# Patient Record
Sex: Female | Born: 1980 | Race: Black or African American | Hispanic: No | Marital: Married | Smoking: Never smoker
Health system: Southern US, Community
[De-identification: ages and names within clinical notes are randomized; demographics above are authoritative.]

## PROBLEM LIST (undated history)

## (undated) DIAGNOSIS — D649 Anemia, unspecified: Secondary | ICD-10-CM

## (undated) HISTORY — PX: OTHER SURGICAL HISTORY: SHX169

---

## 2003-06-25 ENCOUNTER — Encounter: Payer: Self-pay | Admitting: Obstetrics and Gynecology

## 2003-06-25 ENCOUNTER — Inpatient Hospital Stay (HOSPITAL_COMMUNITY): Admission: AD | Admit: 2003-06-25 | Discharge: 2003-06-25 | Payer: Self-pay | Admitting: Obstetrics and Gynecology

## 2003-06-29 ENCOUNTER — Encounter (INDEPENDENT_AMBULATORY_CARE_PROVIDER_SITE_OTHER): Payer: Self-pay | Admitting: Specialist

## 2003-06-29 ENCOUNTER — Ambulatory Visit (HOSPITAL_COMMUNITY): Admission: RE | Admit: 2003-06-29 | Discharge: 2003-06-29 | Payer: Self-pay | Admitting: Obstetrics and Gynecology

## 2003-09-28 ENCOUNTER — Other Ambulatory Visit: Admission: RE | Admit: 2003-09-28 | Discharge: 2003-09-28 | Payer: Self-pay | Admitting: Obstetrics and Gynecology

## 2003-11-07 ENCOUNTER — Emergency Department (HOSPITAL_COMMUNITY): Admission: EM | Admit: 2003-11-07 | Discharge: 2003-11-07 | Payer: Self-pay | Admitting: Emergency Medicine

## 2004-05-16 ENCOUNTER — Inpatient Hospital Stay (HOSPITAL_COMMUNITY): Admission: AD | Admit: 2004-05-16 | Discharge: 2004-05-19 | Payer: Self-pay | Admitting: Obstetrics and Gynecology

## 2004-05-16 ENCOUNTER — Encounter (INDEPENDENT_AMBULATORY_CARE_PROVIDER_SITE_OTHER): Payer: Self-pay | Admitting: Specialist

## 2004-06-26 ENCOUNTER — Other Ambulatory Visit: Admission: RE | Admit: 2004-06-26 | Discharge: 2004-06-26 | Payer: Self-pay | Admitting: Obstetrics and Gynecology

## 2004-08-21 ENCOUNTER — Encounter: Admission: RE | Admit: 2004-08-21 | Discharge: 2004-08-21 | Payer: Self-pay | Admitting: Family Medicine

## 2005-01-08 ENCOUNTER — Emergency Department (HOSPITAL_COMMUNITY): Admission: EM | Admit: 2005-01-08 | Discharge: 2005-01-09 | Payer: Self-pay | Admitting: Emergency Medicine

## 2006-08-13 ENCOUNTER — Ambulatory Visit: Payer: Self-pay | Admitting: Obstetrics and Gynecology

## 2006-09-16 ENCOUNTER — Other Ambulatory Visit: Admission: RE | Admit: 2006-09-16 | Discharge: 2006-09-16 | Payer: Self-pay | Admitting: Obstetrics and Gynecology

## 2006-12-31 ENCOUNTER — Inpatient Hospital Stay (HOSPITAL_COMMUNITY): Admission: AD | Admit: 2006-12-31 | Discharge: 2006-12-31 | Payer: Self-pay | Admitting: Obstetrics and Gynecology

## 2007-01-25 ENCOUNTER — Inpatient Hospital Stay (HOSPITAL_COMMUNITY): Admission: AD | Admit: 2007-01-25 | Discharge: 2007-01-26 | Payer: Self-pay | Admitting: Obstetrics and Gynecology

## 2007-01-29 ENCOUNTER — Inpatient Hospital Stay (HOSPITAL_COMMUNITY): Admission: AD | Admit: 2007-01-29 | Discharge: 2007-01-29 | Payer: Self-pay | Admitting: Obstetrics and Gynecology

## 2007-03-11 ENCOUNTER — Other Ambulatory Visit: Payer: Self-pay | Admitting: Obstetrics and Gynecology

## 2007-03-14 ENCOUNTER — Inpatient Hospital Stay (HOSPITAL_COMMUNITY): Admission: AD | Admit: 2007-03-14 | Discharge: 2007-03-14 | Payer: Self-pay | Admitting: Obstetrics and Gynecology

## 2007-03-16 ENCOUNTER — Encounter (INDEPENDENT_AMBULATORY_CARE_PROVIDER_SITE_OTHER): Payer: Self-pay | Admitting: Obstetrics and Gynecology

## 2007-03-16 ENCOUNTER — Inpatient Hospital Stay (HOSPITAL_COMMUNITY): Admission: AD | Admit: 2007-03-16 | Discharge: 2007-03-20 | Payer: Self-pay | Admitting: Obstetrics and Gynecology

## 2007-04-27 ENCOUNTER — Other Ambulatory Visit: Admission: RE | Admit: 2007-04-27 | Discharge: 2007-04-27 | Payer: Self-pay | Admitting: Obstetrics and Gynecology

## 2007-10-01 ENCOUNTER — Emergency Department (HOSPITAL_COMMUNITY): Admission: EM | Admit: 2007-10-01 | Discharge: 2007-10-01 | Payer: Self-pay | Admitting: Emergency Medicine

## 2008-03-20 ENCOUNTER — Emergency Department (HOSPITAL_COMMUNITY): Admission: EM | Admit: 2008-03-20 | Discharge: 2008-03-20 | Payer: Self-pay | Admitting: Emergency Medicine

## 2008-03-22 ENCOUNTER — Emergency Department (HOSPITAL_COMMUNITY): Admission: EM | Admit: 2008-03-22 | Discharge: 2008-03-22 | Payer: Self-pay | Admitting: *Deleted

## 2008-12-06 ENCOUNTER — Encounter: Payer: Self-pay | Admitting: Emergency Medicine

## 2008-12-06 ENCOUNTER — Encounter (INDEPENDENT_AMBULATORY_CARE_PROVIDER_SITE_OTHER): Payer: Self-pay | Admitting: Obstetrics and Gynecology

## 2008-12-06 ENCOUNTER — Inpatient Hospital Stay (HOSPITAL_COMMUNITY): Admission: AD | Admit: 2008-12-06 | Discharge: 2008-12-08 | Payer: Self-pay | Admitting: *Deleted

## 2009-03-17 ENCOUNTER — Emergency Department (HOSPITAL_COMMUNITY): Admission: EM | Admit: 2009-03-17 | Discharge: 2009-03-17 | Payer: Self-pay | Admitting: Family Medicine

## 2009-03-23 ENCOUNTER — Emergency Department (HOSPITAL_COMMUNITY): Admission: EM | Admit: 2009-03-23 | Discharge: 2009-03-23 | Payer: Self-pay | Admitting: Family Medicine

## 2009-07-03 ENCOUNTER — Emergency Department (HOSPITAL_COMMUNITY): Admission: EM | Admit: 2009-07-03 | Discharge: 2009-07-03 | Payer: Self-pay | Admitting: Emergency Medicine

## 2009-07-03 ENCOUNTER — Emergency Department (HOSPITAL_COMMUNITY): Admission: EM | Admit: 2009-07-03 | Discharge: 2009-07-03 | Payer: Self-pay | Admitting: Family Medicine

## 2009-11-06 ENCOUNTER — Emergency Department (HOSPITAL_COMMUNITY): Admission: EM | Admit: 2009-11-06 | Discharge: 2009-11-06 | Payer: Self-pay | Admitting: Family Medicine

## 2010-09-29 ENCOUNTER — Encounter: Payer: Self-pay | Admitting: Obstetrics and Gynecology

## 2010-10-27 ENCOUNTER — Inpatient Hospital Stay (INDEPENDENT_AMBULATORY_CARE_PROVIDER_SITE_OTHER)
Admission: RE | Admit: 2010-10-27 | Discharge: 2010-10-27 | Disposition: A | Discharge status: Home / Self Care | Payer: BC Managed Care – PPO | Source: Ambulatory Visit | Attending: Emergency Medicine | Admitting: Emergency Medicine

## 2010-10-27 DIAGNOSIS — R112 Nausea with vomiting, unspecified: Secondary | ICD-10-CM

## 2010-10-27 DIAGNOSIS — K5289 Other specified noninfective gastroenteritis and colitis: Secondary | ICD-10-CM

## 2010-12-12 LAB — POCT URINALYSIS DIP (DEVICE)
Protein, ur: NEGATIVE mg/dL
Specific Gravity, Urine: 1.01 (ref 1.005–1.030)
Urobilinogen, UA: 0.2 mg/dL (ref 0.0–1.0)

## 2010-12-12 LAB — BASIC METABOLIC PANEL
BUN: 10 mg/dL (ref 6–23)
Chloride: 105 mEq/L (ref 96–112)
Creatinine, Ser: 0.65 mg/dL (ref 0.4–1.2)

## 2010-12-12 LAB — DIFFERENTIAL
Basophils Absolute: 0 10*3/uL (ref 0.0–0.1)
Eosinophils Relative: 4 % (ref 0–5)
Lymphocytes Relative: 37 % (ref 12–46)
Lymphs Abs: 1.9 10*3/uL (ref 0.7–4.0)
Neutro Abs: 2.8 10*3/uL (ref 1.7–7.7)

## 2010-12-12 LAB — CBC
HCT: 43 % (ref 36.0–46.0)
Platelets: 170 10*3/uL (ref 150–400)
RDW: 14.9 % (ref 11.5–15.5)
WBC: 5.1 10*3/uL (ref 4.0–10.5)

## 2010-12-12 LAB — POCT PREGNANCY, URINE: Preg Test, Ur: NEGATIVE

## 2010-12-15 LAB — POCT RAPID STREP A (OFFICE): Streptococcus, Group A Screen (Direct): NEGATIVE

## 2010-12-18 LAB — CBC
HCT: 34 % — ABNORMAL LOW (ref 36.0–46.0)
Hemoglobin: 11.6 g/dL — ABNORMAL LOW (ref 12.0–15.0)
RBC: 4.07 MIL/uL (ref 3.87–5.11)
WBC: 7.2 10*3/uL (ref 4.0–10.5)

## 2010-12-19 LAB — POCT I-STAT, CHEM 8
BUN: 11 mg/dL (ref 6–23)
Creatinine, Ser: 0.7 mg/dL (ref 0.4–1.2)
Glucose, Bld: 103 mg/dL — ABNORMAL HIGH (ref 70–99)
Potassium: 3.6 mEq/L (ref 3.5–5.1)
Sodium: 136 mEq/L (ref 135–145)
TCO2: 24 mmol/L (ref 0–100)

## 2010-12-19 LAB — URINALYSIS, ROUTINE W REFLEX MICROSCOPIC
Bilirubin Urine: NEGATIVE
Glucose, UA: NEGATIVE mg/dL
Hgb urine dipstick: NEGATIVE
Specific Gravity, Urine: 1.015 (ref 1.005–1.030)
Urobilinogen, UA: 0.2 mg/dL (ref 0.0–1.0)
pH: 8.5 — ABNORMAL HIGH (ref 5.0–8.0)

## 2010-12-19 LAB — CBC
HCT: 38.9 % (ref 36.0–46.0)
MCV: 82.1 fL (ref 78.0–100.0)
Platelets: 148 10*3/uL — ABNORMAL LOW (ref 150–400)
RBC: 4.74 MIL/uL (ref 3.87–5.11)
WBC: 7.5 10*3/uL (ref 4.0–10.5)

## 2010-12-19 LAB — DIFFERENTIAL
Eosinophils Relative: 1 % (ref 0–5)
Lymphocytes Relative: 16 % (ref 12–46)
Lymphs Abs: 1.2 10*3/uL (ref 0.7–4.0)
Monocytes Relative: 4 % (ref 3–12)

## 2010-12-19 LAB — POCT PREGNANCY, URINE: Preg Test, Ur: NEGATIVE

## 2011-01-21 NOTE — Op Note (Signed)
NAMEEFRATA, BRUNNER            ACCOUNT NO.:  [id number]   MEDICAL RECORD NO.:  [id number]          PATIENT TYPE:  INP   LOCATION:  9143                          FACILITY:  [facility]   PHYSICIAN:  James A. [name], M.D.DATE OF BIRTH:  1980-10-27   DATE OF PROCEDURE:  03/16/2007  DATE OF DISCHARGE:  03/20/2007                               OPERATIVE REPORT   PREOPERATIVE DIAGNOSIS:  1. Intrauterine pregnancy at [redacted] weeks gestation.  2. Previous cesarean section.  3. History of intrauterine growth restriction.  4. Labor.   POSTOPERATIVE DIAGNOSIS:  1. Intrauterine pregnancy at [redacted] weeks gestation.  2. Previous cesarean section.  3. History of intrauterine growth restriction.  4. Labor.   PROCEDURE:  Repeat low transverse cesarean section.   SURGEON:  Rudy Jew. [name], M.D.   ANESTHESIA:  Spinal.   FINDINGS:  5 pounds 11 ounces female, Apgars 9 at 1 minute and 9 at 5  minutes sent to the newborn nursery.   ESTIMATED BLOOD LOSS:  500 mL.   COMPLICATIONS:  None.   PACKS AND DRAINS:  Foley.   COUNTS:  Sponge, needle, and instrument count reported as correct x2.   PROCEDURE IN DETAIL:  The patient was taken to the operating room and  placed in the sitting position.  After spinal anesthetic was  administered, she was placed in the dorsal supine position and prepped  and draped in the usual manner for abdominal surgery.  A Foley catheter  was placed.  A Pfannenstiel tensor incision was made down to the level  of the fascia which was nicked with a knife and incised transversely  with Mayo scissors.  The underlying rectus muscles were separated from  the fascia using sharp and blunt dissection.  The rectus muscles were  separated in the midline exposing the peritoneum which was elevated with  hemostats and entered atraumatically with Metzenbaum scissors.  The  incision was extended longitudinally.  The uterus was then entered  through a low transverse incision using sharp and blunt  dissection.  The  fluid was noted be clear.  The infant was delivered from a vertex  presentation in an atraumatic manner.  The infant was suctioned.  The  cord was doubly clamped, cut and the infant given immediately to the  awaiting pediatrics team.  The placenta and membranes were removed in  their entirety and submitted to pathology.  The uterus was exteriorized.  The uterus was then closed in two running layers of #1 Vicryl.  The  first was a running locking layer, the second was a running,  intermittently locking, imbricating layer.  Hemostasis was noted.  During closure, anesthesia noted that the urine was slightly blood  tinged.  Hence, methylene blue was instilled into the bladder in a  volume of approximately 400 mL.  There was no extravasation of the dye  noted at all.  Hence, the bladder was once again drained and the  procedure resumed.  The uterus, tubes and ovaries were inspected and  found to be normal and were returned to the abdominal cavity.  Copious  irrigation was accomplished.  Hemostasis was noted.  The peritoneum was  then closed with 3-0 Vicryl in a running fashion.  The fascia was closed  with 0 Vicryl in a running fashion.  The skin was closed with staples.  The patient tolerated the procedure extremely well and was returned to  the recovery room in good condition.      James A. [name], M.D.  Electronically Signed     JAM/MEDQ  D:  03/19/2007  T:  03/20/2007  Job:  [id number]

## 2011-01-21 NOTE — Op Note (Signed)
Daisy Reed, Daisy Reed NO.:  [id number]   MEDICAL RECORD NO.:  [id number]          PATIENT TYPE:  INP   LOCATION:  9303                          [facility]  [facility]   PHYSICIAN:  Huel Cote, M.D. DATE OF BIRTH:  [birth year]   DATE OF PROCEDURE:  12/06/2008  DATE OF DISCHARGE:                               OPERATIVE REPORT   PREOPERATIVE DIAGNOSES:  1. Acute abdominal pain.  2. Large cystic mass in the right pelvis.   POSTOPERATIVE DIAGNOSES:  1. Acute abdominal pain.  2. Large cystic mass in the right pelvis with torsion of right ovarian      mass.   PROCEDURES:  Minilaparotomy, right salpingo-oophorectomy.   SURGEON:  Huel Cote, MD   ANESTHESIA:  General.   FINDINGS:  There was a large necrotic appearing right ovarian mass  approximately 10 cm in diameter, could be a hemorrhagic cyst, which  torsed due to the necrotic nature.  It was difficult to determine the  pathology.  Normal left ovary and tube were noted.  Normal uterus and  appendix were noted.   SPECIMEN:  The right ovary and tube were sent to Pathology.   ESTIMATED BLOOD LOSS:  75 mL.   URINE OUTPUT:  175 mL clear urine.   INTRAVENOUS FLUIDS:  1200 mL LR.   PROCEDURE:  The patient was taken to the operating room where general  anesthesia was obtained without difficulty.  She was then prepped and  draped in normal sterile fashion in the dorsal supine position with a  Foley catheter in place.  An incision was made through a preexisting  scar on the abdomen and small Pfannenstiel minilaparotomy incision made.  This was then carried down to the underlying layer of fascia by sharp  dissection and Bovie cautery.  The abdominal wall itself was quite  scarred from her previous two C-sections.  The fascia was nicked in the  midline and the incision was extended laterally with Mayo scissors.  The  inferior aspect of the incision was grasped with Kocher clamps and  elevated.  The rectus  muscles underneath were quite adherent to the  fascia.  So with some difficulty, the rectus muscles were dissected away  from the lower fascial segment with Mayo scissors.  In a similar  fashion, the superior aspect was elevated and dissected off the rectus  muscles.  These were also quite adherent and this was a difficult  dissection.  The peritoneal cavity was entered sharply and the incision  extended both superiorly and inferiorly with careful attention to avoid  both bowel and bladder.  With this in place, pelvic washings were  obtained given that the exact nature of the pelvic mass was unknown and  the Alexis medium wound retractor placed within the incision.  The right  adnexa was immediately identifiable as it was quite enlarged.  This was  elevated and pulled through the incision.  It was noted to be torsed  upon itself, in 3-4 four twists, it was untorsed and appeared to be gray  and necrotic in nature.  The tissue appearing nonviable.  Decision was  made  to proceed with an RSO, thus the infundibulopelvic and the utero-  ovarian ligament were crossclamped as well as the fallopian tube and two  Kelly clamps.  The adnexa itself was then amputated from the pedicle.  The pedicle was then secured with two suture ligatures of 0 Vicryl with  good hemostasis noted.  The abdomen and pelvis were then cleared of all  excess bleeding and irrigation from collecting the washings.  The ureter  on the right was inspected and found to be normal.  The remainder of the  pelvis appeared relatively unscarred and again, the left ovary and tube  were completely normal and the appendix appeared normal.  At this point,  all instruments and sponges were removed from the patient's abdomen.  The pedicle once again inspected and seemed to be hemostatic; therefore,  the peritoneum and rectus muscles were reapproximated with several  interrupted sutures of 0 Vicryl.  The fascia was noted to have a small   defect in the superior aspect from the difficult dissection and this was  closed separately with a running suture of 0 Vicryl.  The fascia itself  was then closed in a running fashion with 0 Vicryl.  The subcutaneous  tissue was reapproximated with 3-0 plain as the patient had a redundant  abdomen flap from her prior surgeries and pregnancies, and this was done  in an attempt to even out the layers.  Finally, the skin was closed with  staples and again, sponge, lap, and needle counts were correct x2.      Huel Cote, M.D.  Electronically Signed     KR/MEDQ  D:  12/06/2008  T:  12/07/2008  Job:  [id number]

## 2011-01-21 NOTE — Discharge Summary (Signed)
Daisy Reed, DIMITROFF            ACCOUNT NO.:  [id number]   MEDICAL RECORD NO.:  [id number]          PATIENT TYPE:  INP   LOCATION:  9303                          [facility]   PHYSICIAN:  Sherron Monday, MD        DATE OF BIRTH:  01/13/1981   DATE OF ADMISSION:  12/06/2008  DATE OF DISCHARGE:  12/08/2008                               DISCHARGE SUMMARY   ADMITTING DIAGNOSES:  Right acute abdomen, large cystic mass in the  right adnexa.   DISCHARGE DIAGNOSES:  Right acute abdomen, large cystic mass in the  right adnexa, status post right salpingo-oophorectomy with exploratory  laparotomy.   PROCEDURE:  Exploratory laparotomy, right salpingo-oophorectomy.   HISTORY OF PRESENT ILLNESS:  A 30 year old G3, P2-0-1-2 without cycle  secondary to an IUD for several years who approximately a year ago began  to have pain increasing in intensity and acuity.  On the morning of the  31st, went to the ER, received multiple doses of narcotics.  Nausea,  vomiting, off and on all day with pain.   PAST MEDICAL HISTORY:  Nonsignificant.   PAST SURGICAL HISTORY:  1. Significant for low transverse cesarean section x2, first time for      failure to progress, the second one is a repeat.  2. Miscarriage and D and C.   PAST OB/GYN HISTORY:  Two term low transverse cesarean sections in 2005,  2008, and a miscarriage with D and C.  No abnormal Pap smears or  sexually transmitted diseases.   MEDICATIONS:  Mirena.   ALLERGIES:  No known drug allergies.   SOCIAL HISTORY:  She is married and denies alcohol, tobacco, or drug  use.   FAMILY HISTORY:  The patient denies.   On admission, she is afebrile.  Vital signs stable with guarding and  diffuse tenderness without rebound, but has received multiple doses of  narcotics.  On ultrasound, multiloculated cystic right adnexa, 10 cm x 6  cm x 7.7 cm was noted.  Uterus within normal limits with IUD and left  ovary within normal limits.  Her pain was  consistent with a torsion, so  she underwent an exploratory laparotomy for treatment in the course of  the minilaparotomy, a large necrotic right ovarian mass was noted  approximately 10 cm and RSO was performed through minilaparotomy and  this was sent to pathology.  EBL was approximately 75 mL.  Her  postoperative course was relatively uncomplicated.  She had well  controlled pain with medication.  She is ambulating, voiding, and had  passed gas on postoperative day #2, requesting discharge to home.  She  will be discharged to home with staples that will be removed earlier in  the week with a checkup with Dr. Senaida Ores.  Her abdomen was soft, it  was tender with good bowel sounds and  nondistended.  She was discharged to home with prescription for Motrin,  Vicodin, and instructions for Colace and MiraLax for constipation.  She  was given routine discharge instructions and numbers to call with any  questions or problems.      Sherron Monday, MD  Electronically Signed     JB/MEDQ  D:  12/08/2008  T:  12/09/2008  Job:  [id number]

## 2011-01-24 NOTE — Op Note (Signed)
Daisy Reed, Daisy Reed                        ACCOUNT NO.:  [id number]   MEDICAL RECORD NO.:  [id number]                   PATIENT TYPE:  INP   LOCATION:  9111                                 [facility]   PHYSICIAN:  Rudy Jew. [name], M.D.             DATE OF BIRTH:  05-17-1981   DATE OF PROCEDURE:  05/16/2004  DATE OF DISCHARGE:                                 OPERATIVE REPORT   PREOPERATIVE DIAGNOSES:  1.  Intrauterine pregnancy at 40 1/[redacted] weeks gestation.  2.  Arrest disorder of dilatation in labor.   POSTOPERATIVE DIAGNOSES:  1.  Intrauterine pregnancy at 40 1/[redacted] weeks gestation.  2.  Arrest disorder of dilatation in labor.   PROCEDURE:  Primary low transverse cesarean section.   SURGEON:  Rudy Jew. [name], M.D.   ANESTHESIA:  Epidural.   FINDINGS:  7 pound 9 ounce female, Apgar's 9 at 1 minute, 9 at 5 minute,  sent to the newborn nursery.  Nuchal cord x2.   ESTIMATED BLOOD LOSS:  800 mL.   COMPLICATIONS:  None.   PACKS/DRAINS:  Foley.   COUNTS:  Sponge, needle, instrument counts reported as correct x2.   DESCRIPTION OF PROCEDURE:  The patient was taken to the operating room and  placed in the dorsal supine position.  Epidural anesthetic was dosed to a  surgical level.  The patient was prepped and draped in the usual manner for  abdominal surgery. A Foley catheter had been previously placed.  A  Pfannenstiel skin incision was made down to the level of the fascia which  was nicked with a knife and incised transversely with Mayo scissors. The  underlying rectus muscles were separated from the fascia using sharp and  blunt dissection.  The rectus muscles were separated in the midline exposing  the peritoneum which was elevated with hemostats and entered atraumatically  with Metzenbaum scissors. The incision was extended longitudinally.  The  uterus was identified and a bladder created by incising the anterior uterine  serosa. It was undermined and the bladder  retracted inferiorly with the  bladder blade. The uterus was entered through a low transverse incision  using sharp and blunt dissection.  The infant was delivered from the vertex  presentation. There was a nuchal cord x2. At delivery of the head, the  infant's airway was suctioned. Full delivery was then accomplished. The cord  was triply clamped, cut and the infant given immediately to the awaiting  pediatrics team.  Arterial cord pH was obtained from an isolated segment.  Then regular cord blood was obtained. The placenta and membranes were  removed in their entirety by gentle traction on the umbilical cord only. The  placenta and membranes were sent to pathology for histologic evaluation. The  uterus was exteriorized. The uterus was then closed in two running layers  with #1 Vicryl. The first was a running locking layer, the second was a  running, intermittently locking,  and imbricating layer.  Three additional  figure-of-eight sutures were required to obtain hemostasis.  Hemostasis was  noted. The uterus tubes and ovaries were inspected and found to be otherwise  normal.  They were returned to the abdominal cavity. Copious irrigation was  accomplished. Hemostasis was noted. The peritoneum was then closed with #0  Vicryl in a running fashion. The fascia was closed with #0 Vicryl in a  running fashion. The skin was closed with staples.   The patient tolerated the procedure extremely well and was returned to the  recovery room in good condition.  At the conclusion of the procedure, the  urine was clear and copious.                                               James A. [name], M.D.    JAM/MEDQ  D:  05/16/2004  T:  05/17/2004  Job:  [id number]

## 2011-01-24 NOTE — Discharge Summary (Signed)
Daisy Reed, Daisy Reed            ACCOUNT NO.:  [id number]   MEDICAL RECORD NO.:  [id number]          PATIENT TYPE:  INP   LOCATION:  9143                          [facility]   PHYSICIAN:  James A. [name], M.D.DATE OF BIRTH:  [birth year]   DATE OF ADMISSION:  03/16/2007  DATE OF DISCHARGE:  03/20/2007                               DISCHARGE SUMMARY   DISCHARGE DIAGNOSES:  1. Intrauterine pregnancy at [redacted] weeks gestation, delivered.  2. Previous cesarean section.  3. Intrauterine growth restriction.  4. O negative blood type.  5. Anemia - asymptomatic   OPERATIONS/PROCEDURES:  Repeat low transverse cesarean section.   CONSULTATIONS:  None.   DISCHARGE MEDICATIONS:  Percocet, Motrin 600 mg, Chromagen, prenatal  vitamins.   HISTORY AND PHYSICAL:  A 30 year old, gravida 3, para 1, AB 1 at [redacted]  weeks gestation with the aforementioned risk factors.  The patient  presented for repeat cesarean section. For the remainder of the history  and physical please see chart.   HOSPITAL COURSE:  The patient was admitted to [facility]  [place].  Initial laboratory studies were drawn.  On March 16, 2007,  she was taken to the operating room and underwent repeat low transverse  cesarean section per Dr. Sylvester Harder.  The procedure yielded a 5  pound 11 ounces female, Apgar's 9 at 1 minute and 9 at 5 minutes, sent to  the newborn nursery.  The patient's postoperative course was benign and  characterized only by an asymptomatic anemia.  The patient was felt to  be stable for discharge March 20, 2007 and was discharged home afebrile  and in satisfactory condition.   DISPOSITION:  The patient is to return to [facility]s in 4-6 weeks for post operative evaluation.      James A. [name], M.D.  Electronically Signed     JAM/MEDQ  D:  05/13/2007  T:  05/13/2007  Job:  [id number]

## 2011-01-24 NOTE — Op Note (Signed)
   NAME:  Daisy Reed, Daisy Reed NO.:  [id number]   MEDICAL RECORD NO.:  [id number]                   PATIENT TYPE:  AMB   LOCATION:  SDC                                  [facility]   PHYSICIAN:  James A. [name], M.D.             DATE OF BIRTH:  10/14/1980   DATE OF PROCEDURE:  06/29/2003  DATE OF DISCHARGE:                                 OPERATIVE REPORT   PREOPERATIVE DIAGNOSIS:  Missed abortion at approximately eight weeks.   POSTOPERATIVE DIAGNOSIS:  Missed abortion at approximately eight weeks,  pathology pending.   PROCEDURE:  Suction dilatation and curettage.   SURGEON:  Rudy Jew. [name], M.D.   ANESTHESIA:  Monitored anesthesia care with 1% Xylocaine paracervical block  (18 mL).   FINDINGS:  Uterus sounded to approximately 7 cm, and it was noted to be  anteverted.  A moderate amount of products of conception.   ESTIMATED BLOOD LOSS:  50 mL.   COMPLICATIONS:  None.   PACKS AND DRAINS:  None.   DESCRIPTION OF PROCEDURE:  The patient was taken to the operating room and  placed in the dorsal supine position.  After adequate IV sedation was  administered, she was placed in the lithotomy position and prepped and  draped in the usual manner for vaginal surgery.  A posterior weighted  retractor was placed per vagina.  The anterior lip of the cervix was grasped  with a single-tooth tenaculum.  The uterus was gently sounded to  approximately 7 cm and noted to be anteverted.  The cervix was then serially  dilated to a size 25 [place] using [employer] dilators.  An 8 mm suction curette  was introduced into the uterine cavity.  Suction was applied.  A moderate  amount of apparent products of conception was delivered through the tubing.  After several passes of the suction curette, no additional tissue was  obtained.  At this point the curettage portion of the procedure was  concluded.  The tenaculum was removed and a small anterior cervical  laceration was noted.  It was easily closed with a 3-0 chromic in an  interrupted fashion.  Hemostasis was noted and the procedure terminated.   The patient tolerated the procedure extremely well and was returned to the  recovery room in good condition.  As she is Rh negative, RhoGAM will be  administered.                                               James A. [name], M.D.    JAM/MEDQ  D:  06/29/2003  T:  06/29/2003  Job:  [id number]

## 2011-01-24 NOTE — Discharge Summary (Signed)
Daisy Reed, Daisy Reed            ACCOUNT NO.:  [id number]   MEDICAL RECORD NO.:  [id number]          PATIENT TYPE:  INP   LOCATION:  9111                          [facility]   PHYSICIAN:  Rudy Jew. [name], M.D.DATE OF BIRTH:  1981-09-05   DATE OF ADMISSION:  05/16/2004  DATE OF DISCHARGE:  05/19/2004                                 DISCHARGE SUMMARY   DISCHARGE DIAGNOSES:  1.  Intrauterine pregnancy at 40+ weeks' gestation, delivered.  2.  Advanced cervical changes at term.  3.  Group B strep carrier.  4.  Rh negative.  5.  Arrest disorder of dilatation.   OPERATIONS/SPECIAL PROCEDURES:  Primary low transverse cesarean section.   CONSULTATIONS:  None.   DISCHARGE MEDICATIONS:  1.  Iron.  2.  Prenatal vitamins.  3.  Percocet.   HISTORY OF PRESENT ILLNESS:  This is a 30 year old gravida 2, para 0, AB 1,  at 40 weeks 5 days' gestation.  Please note aforementioned risk factors.  The patient presented for induction secondary to the above and advanced  cervical changes at term.  For the remainder of the history and physical,  please see the chart.   HOSPITAL COURSE:  The patient was admitted to [facility]  [place].  Initial laboratory studies were drawn.  Initial examination  revealed the cervix to be 3 cm dilated, 75% effaced, -2 station and vertex  presentation.  Artificial rupture of membranes was accomplished.  Intrauterine pressure catheter was placed.  The patient was ultimately  diagnosed May 16, 2004, with an arrest disorder of dilatation in labor.  She was taken to the operating room and underwent primary low transverse  cesarean section per Dr. Sylvester Harder.  The procedure yielded a 7 pound, 9  ounce female, Apgars 9 at one minute, 9 at five minutes, sent to the newborn  nursery.  Arterial cord pH was 7.32.  The patient's postoperative course was  complicated only by anemia.  Hemoglobin September 2005 was 8.4.  Hemoglobin  April 17, 2004, was  6.9.  On May 19, 2004, the patient was felt to be  stable for discharge and discharged home per Dr. Lily Peer, afebrile and in  satisfactory condition.   DISPOSITION:  The patient is to return to [facility]st[facility]s in six weeks for postpartum evaluation.     JAM/MEDQ  D:  06/05/2004  T:  06/06/2004  Job:  [id number]

## 2011-03-05 ENCOUNTER — Inpatient Hospital Stay (INDEPENDENT_AMBULATORY_CARE_PROVIDER_SITE_OTHER)
Admission: RE | Admit: 2011-03-05 | Discharge: 2011-03-05 | Disposition: A | Discharge status: Home / Self Care | Payer: BC Managed Care – PPO | Source: Ambulatory Visit | Attending: Emergency Medicine | Admitting: Emergency Medicine

## 2011-03-05 DIAGNOSIS — J309 Allergic rhinitis, unspecified: Secondary | ICD-10-CM

## 2011-06-05 LAB — URINE MICROSCOPIC-ADD ON

## 2011-06-05 LAB — URINALYSIS, ROUTINE W REFLEX MICROSCOPIC
Glucose, UA: NEGATIVE
Specific Gravity, Urine: 1.02
Urobilinogen, UA: 1
pH: 5.5

## 2011-06-05 LAB — DIFFERENTIAL
Lymphs Abs: 1
Monocytes Relative: 7
Neutro Abs: 7.1
Neutrophils Relative %: 81 — ABNORMAL HIGH

## 2011-06-05 LAB — POCT PREGNANCY, URINE: Preg Test, Ur: NEGATIVE

## 2011-06-05 LAB — POCT I-STAT, CHEM 8
BUN: 11
Chloride: 105
Creatinine, Ser: 1
Potassium: 3.3 — ABNORMAL LOW
Sodium: 140

## 2011-06-05 LAB — CBC
Hemoglobin: 13.1
RBC: 4.77
WBC: 8.8

## 2011-06-05 LAB — RAPID STREP SCREEN (MED CTR MEBANE ONLY): Streptococcus, Group A Screen (Direct): POSITIVE — AB

## 2011-06-23 IMAGING — CT CT PELVIS W/ CM
3 of 4 series · 14 of 32 positions shown, 19 images · IV contrast (water/omni  & 100 ML OMNI 300)
Comparison: 12/06/2008.

CT ABDOMEN

CLINICAL DATA: Abdominal pain, nausea and vomiting.

CT ABDOMEN AND PELVIS WITH CONTRAST
TECHNIQUE: Multidetector CT imaging of the abdomen and pelvis was
performed using the standard protocol following bolus
administration of intravenous contrast.
Contrast: 100 ml Nmnipaque-BOO

[Series 2: routine abdomen · axial · 0.70mm/px · z∈[-336,-110]mm · 4 of 77 slices shown, 9 images]
[im 16/77  soft-tissue]
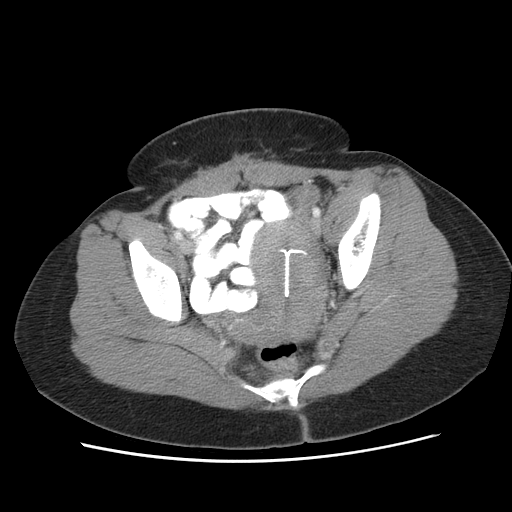
[im 16/77  lung]
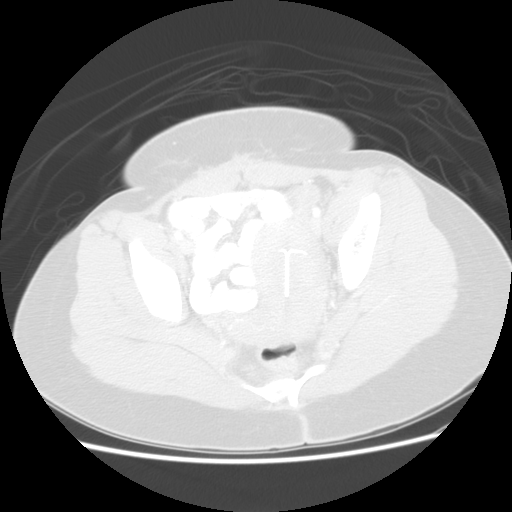
[im 16/77  bone]
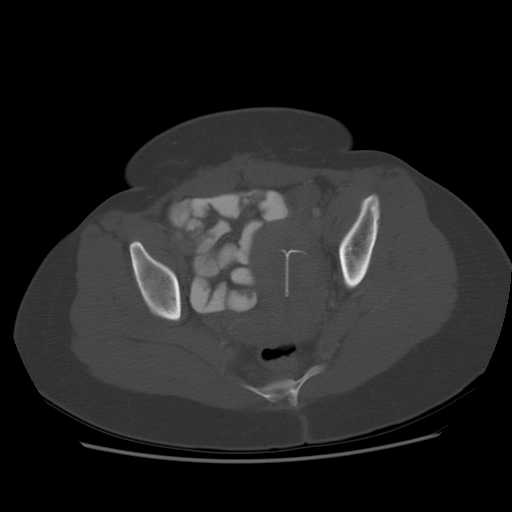
[im 31/77  soft-tissue]
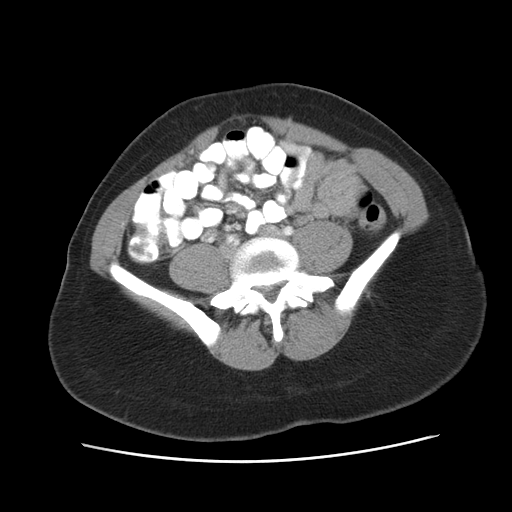
[im 31/77  lung]
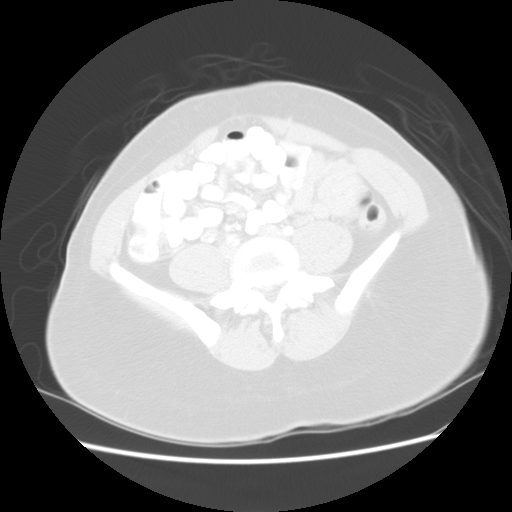
[im 46/77  soft-tissue]
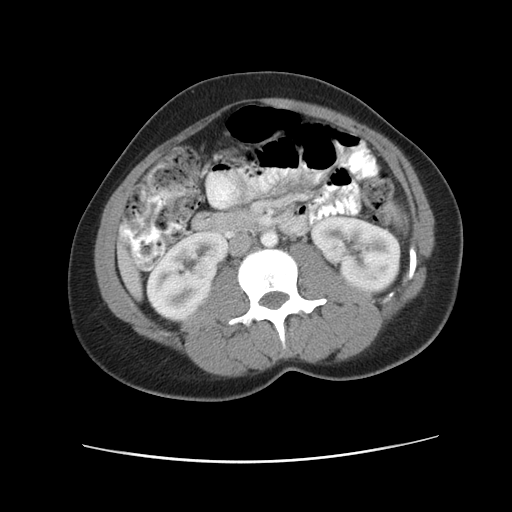
[im 46/77  lung]
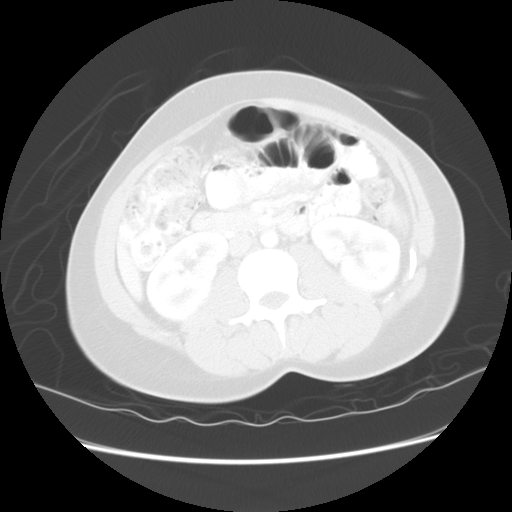
[im 61/77  soft-tissue]
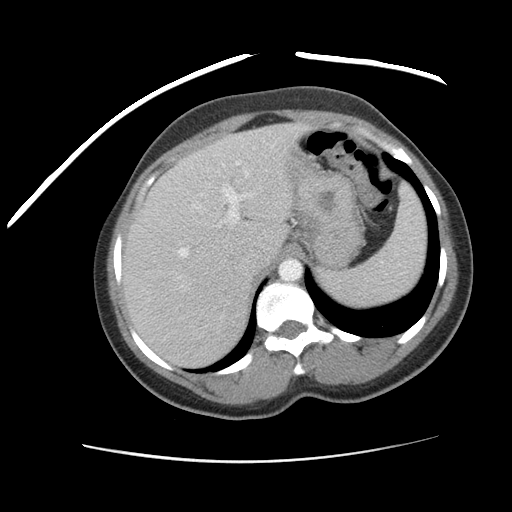
[im 61/77  lung]
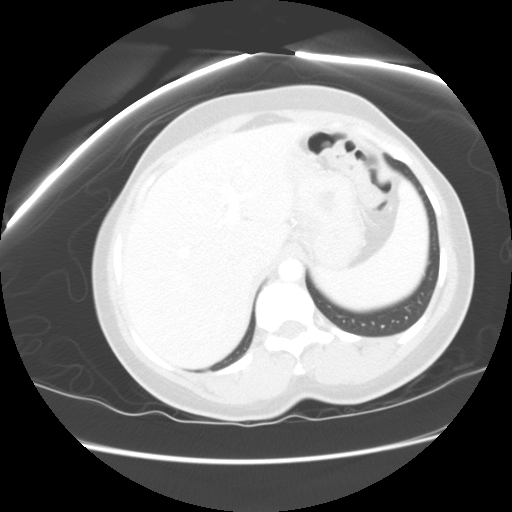

[Series 400: reformatted · sagittal · 0.80mm/px · 8 of 158 slices shown (1 of 2)]
[im 14/158  soft-tissue]
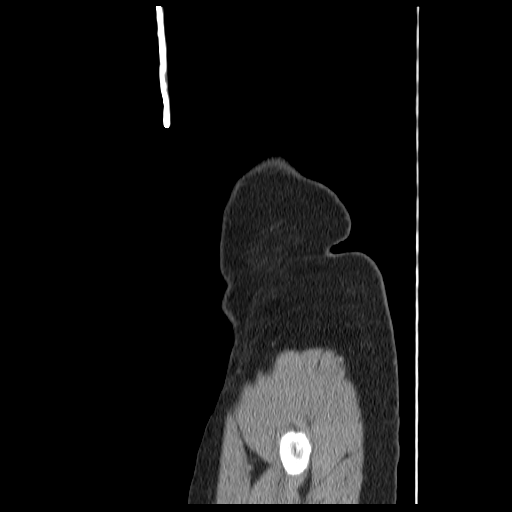
[im 40/158  soft-tissue]
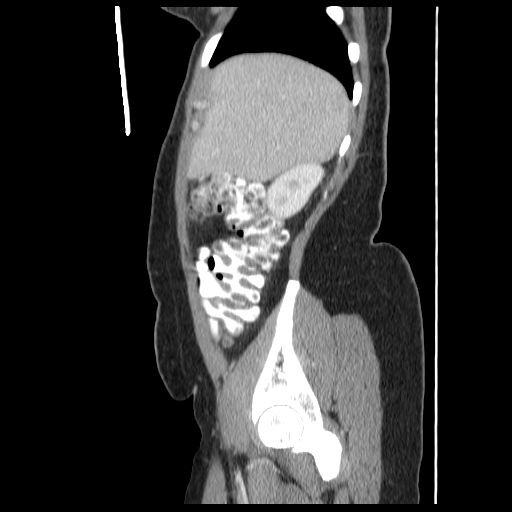
[im 53/158  soft-tissue]
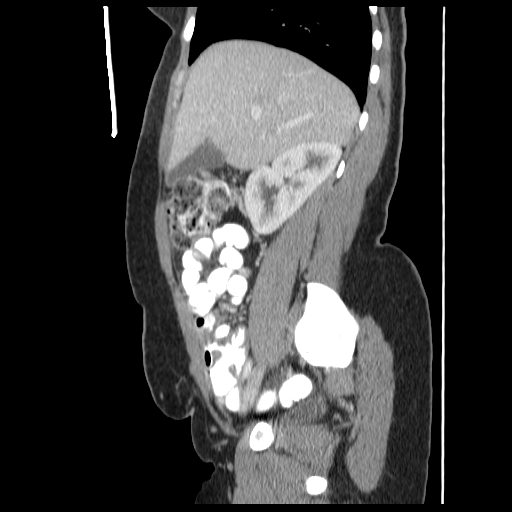
[im 66/158  soft-tissue]
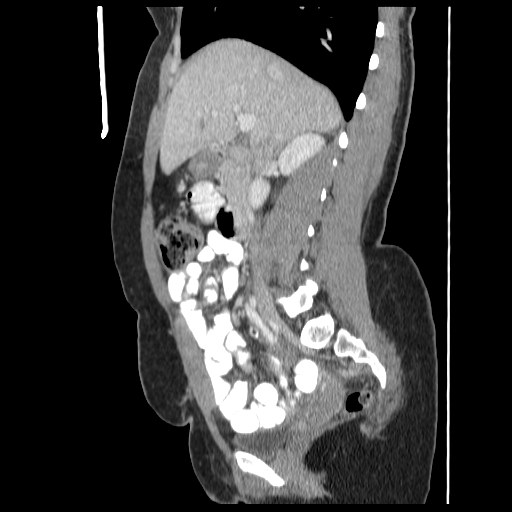
[im 92/158  soft-tissue]
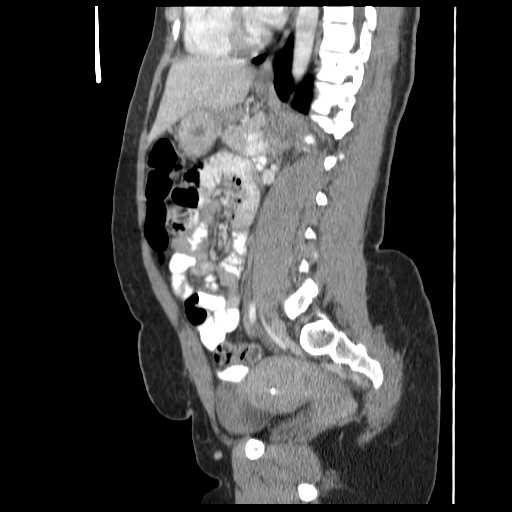
[im 105/158  soft-tissue]
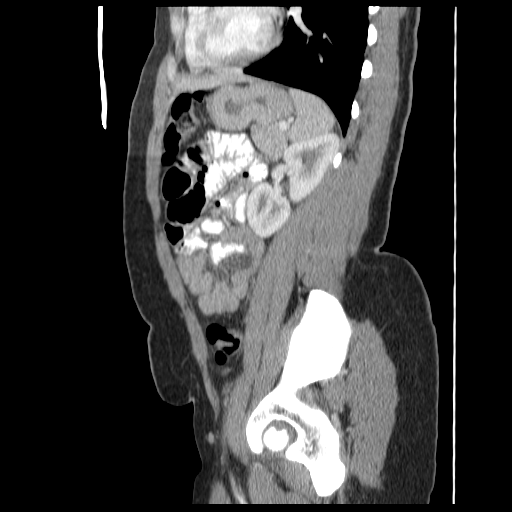
[im 118/158  soft-tissue]
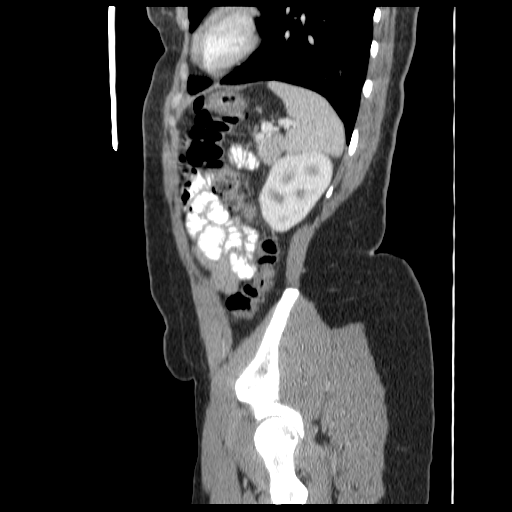
[im 144/158  soft-tissue]
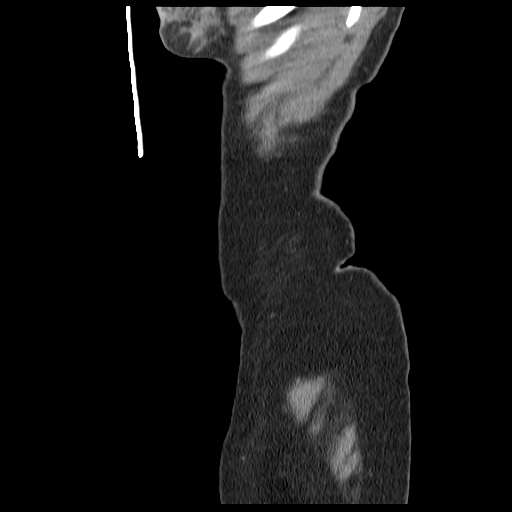

[Series 401: reformatted · coronal · 0.80mm/px · 2 of 131 slices shown (2 of 2)]
[im 14/131  soft-tissue]
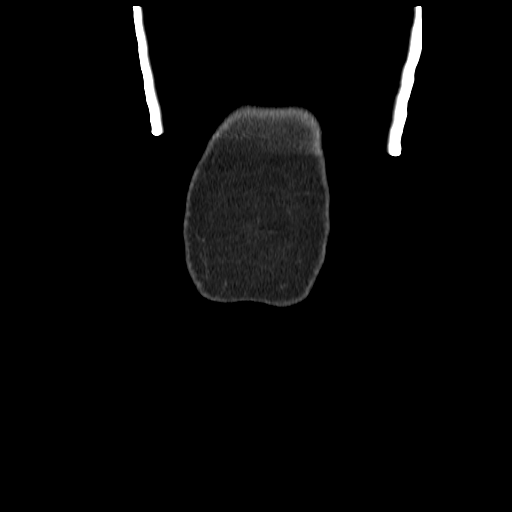
[im 27/131  soft-tissue]
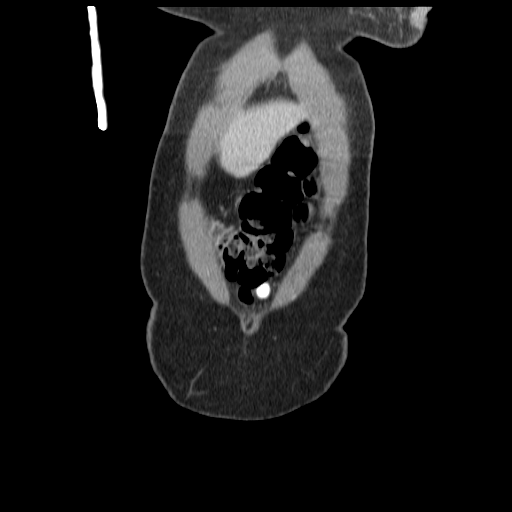

[14 of 32 positions shown; findings below may reference images not displayed]

FINDINGS: Normal appearing liver, spleen, pancreas, gallbladder,
kidneys and adrenal glands.  Mildly dilated proximal small bowel
loops with normal caliber distal loops.  No hernia or mass seen.
No free peritoneal fluid or air.  No enlarged lymph nodes.  Clear
lung bases.  Unremarkable bones.
IMPRESSION: Mild jejunal ileus or partial obstruction.

CT PELVIS
FINDINGS: Intrauterine device within the endometrial cavity.
Unremarkable colon and small bowel loops.  Poorly distended urinary
bladder.  2.8 cm simple appearing left ovarian cyst as well as
additional smaller left ovarian follicles.  The right ovary is not
identified and may be surgically absent.  Normal appearing
appendix.  No free peritoneal fluid.  Unremarkable bones.  No
enlarged lymph nodes.
IMPRESSION: 1.  2.8 cm simple appearing left ovarian cyst.
2.  No acute abnormality.

## 2011-06-24 LAB — CBC
HCT: 27.8 — ABNORMAL LOW
HCT: 34.3 — ABNORMAL LOW
Hemoglobin: 9.2 — ABNORMAL LOW
MCHC: 33.2
MCHC: 33.3
MCV: 78.4
MCV: 78.9
Platelets: 157
Platelets: 166
Platelets: 196
RBC: 3.54 — ABNORMAL LOW
RDW: 15.6 — ABNORMAL HIGH
RDW: 16.4 — ABNORMAL HIGH
WBC: 6.4

## 2011-06-24 LAB — TYPE AND SCREEN: Antibody Screen: POSITIVE

## 2011-06-24 LAB — RH IMMUNE GLOB WKUP(>/=20WKS)(NOT WOMEN'S HOSP)

## 2011-09-08 ENCOUNTER — Emergency Department (INDEPENDENT_AMBULATORY_CARE_PROVIDER_SITE_OTHER): Payer: BC Managed Care – PPO

## 2011-09-08 ENCOUNTER — Emergency Department (INDEPENDENT_AMBULATORY_CARE_PROVIDER_SITE_OTHER)
Admission: EM | Admit: 2011-09-08 | Discharge: 2011-09-08 | Disposition: A | Discharge status: Home / Self Care | Payer: BC Managed Care – PPO | Source: Home / Self Care | Attending: Family Medicine | Admitting: Family Medicine

## 2011-09-08 DIAGNOSIS — R05 Cough: Secondary | ICD-10-CM

## 2011-09-08 DIAGNOSIS — R059 Cough, unspecified: Secondary | ICD-10-CM

## 2011-09-08 MED ORDER — GUAIFENESIN-CODEINE 100-10 MG/5ML PO SYRP: 5.0000 mL | ORAL_SOLUTION | Freq: Three times a day (TID) | ORAL | Status: AC | PRN

## 2011-09-08 NOTE — ED Notes (Signed)
3 day hx of cough with fever. Has coughed up white sputum.  Has body aches.  Taken Nyquil for symptoms

## 2011-09-08 NOTE — ED Provider Notes (Signed)
Daisy Reed is a 30 year old woman with one week of cold symptoms described as runny nose fevers chills. She additionally has had a cough for this last week however today she noted some flecks of blood in her sputum. Additionally she notes that she is a sore throat .She feels well otherwise. She thinks she is getting worse overall however.   No new fevers or chills no dyspnea. Is not a smoker.  PMH reviewed.  ROS as above otherwise neg Medications reviewed.   Exam:  BP 120/80  Pulse 82  Temp(Src) 98.5 F (36.9 C) (Oral)  Resp 20  SpO2 99% Gen: Well NAD HEENT: EOMI,  MMM Lungs: Normal work of breathing, coarse breath sounds bilaterally.  No wheeze or rhonchi. Heart: RRR no MRG Abd: NABS, NT, ND Exts: Non edematous BL  LE, warm and well perfused.   CXR: Essentially normal  A/P: 30 year old woman with URI with blood in her sputum. I think her blood in her sputum is coming from sore throat or airway irritation with cough.  Doubt serious cause as her vital signs are normal as is her chest x-ray and she appears well.  Plan to provide codeine with guaifenesin cough medicine.  Advise that this should get better in one week and if not to return to clinic for further evaluation. Handout given.   Clementeen Graham 09/08/11 2043

## 2011-09-09 NOTE — ED Provider Notes (Signed)
Medical screening examination/treatment/procedure(s) were performed by non-physician practitioner and as supervising physician I was immediately available for consultation/collaboration.   Oceanna Arruda DOUGLAS MD.    Lesslie Mossa Douglas Destanee Bedonie, MD 09/09/11 1403 

## 2012-04-07 ENCOUNTER — Encounter (HOSPITAL_COMMUNITY): Payer: Self-pay | Admitting: *Deleted

## 2012-04-07 ENCOUNTER — Encounter (HOSPITAL_COMMUNITY): Payer: Self-pay | Admitting: Pharmacist

## 2012-04-14 MED ORDER — DEXTROSE 5 % IV SOLN
2.0000 g | INTRAVENOUS | Status: AC
  Administered 2012-04-15: 2 g via INTRAVENOUS
  Filled 2012-04-14: qty 2

## 2012-04-14 NOTE — H&P (Addendum)
  Daisy Reed  [id number]  03/23/12 S: Shakaria presents today for IUD removal.  She had an IUD inserted a little over five years ago by a doctor before she moved.  She has had a Cesarean section in the past.  She was here last year and the string was not seen, so ultrasound was performed showing a normal IUD placement.  The patient desires removal and wanted to go to a birth control pill.  She started the birth control pill just two weeks ago.   O: Attempts finding the string were unsuccessful.  I was able to explore the external to internal os, the cervix, using hysteroscopic graspers without success finding the string.  Mild dilation of the cervix.  The patient did not tolerate this very well.  After placing the tenaculum and using the sound, I was able to dilate it slightly and with attempts to insert the hysteroscopic grasper, the patient was unable to tolerate this.  I was unable to grasp the string due to the fact that it was not in the cervix. A&P: Cervical stenosis.  Lost IUD string, but proper placement by ultrasound.  Unable to tolerate removal in the office.  The patient desires removal in the operating room.  Plan hysteroscopic removal of IUD.  Discussed other options such as replacing the IUD.  She would like to consider that and we will precerti 04/15/12 1715 This patient has been seen and examined.   All of her questions were answered.  Labs and vital signs reviewed.  Informed consent has been obtained.  The History and Physical is current.  DL

## 2012-04-15 ENCOUNTER — Ambulatory Visit (HOSPITAL_COMMUNITY)
Admission: RE | Admit: 2012-04-15 | Discharge: 2012-04-15 | Disposition: A | Discharge status: Home / Self Care | Payer: BC Managed Care – PPO | Source: Ambulatory Visit | Attending: Obstetrics and Gynecology | Admitting: Obstetrics and Gynecology

## 2012-04-15 ENCOUNTER — Encounter (HOSPITAL_COMMUNITY)
Admission: RE | Disposition: A | Discharge status: Home / Self Care | Payer: Self-pay | Source: Ambulatory Visit | Attending: Obstetrics and Gynecology

## 2012-04-15 ENCOUNTER — Encounter (HOSPITAL_COMMUNITY): Payer: Self-pay | Admitting: Anesthesiology

## 2012-04-15 ENCOUNTER — Encounter (HOSPITAL_COMMUNITY): Payer: Self-pay | Admitting: *Deleted

## 2012-04-15 ENCOUNTER — Ambulatory Visit (HOSPITAL_COMMUNITY): Payer: BC Managed Care – PPO | Admitting: Anesthesiology

## 2012-04-15 DIAGNOSIS — Z30433 Encounter for removal and reinsertion of intrauterine contraceptive device: Secondary | ICD-10-CM | POA: Insufficient documentation

## 2012-04-15 DIAGNOSIS — N882 Stricture and stenosis of cervix uteri: Secondary | ICD-10-CM

## 2012-04-15 HISTORY — DX: Anemia, unspecified: D64.9

## 2012-04-15 HISTORY — PX: HYSTEROSCOPY W/D&C: SHX1775

## 2012-04-15 HISTORY — PX: IUD REMOVAL: SHX5392

## 2012-04-15 LAB — CBC
MCH: 27.5 pg (ref 26.0–34.0)
MCV: 80.1 fL (ref 78.0–100.0)
Platelets: 143 10*3/uL — ABNORMAL LOW (ref 150–400)
RDW: 14.8 % (ref 11.5–15.5)

## 2012-04-15 SURGERY — DILATATION AND CURETTAGE /HYSTEROSCOPY
Anesthesia: General | Site: Vagina | Wound class: Clean Contaminated

## 2012-04-15 MED ORDER — DEXAMETHASONE SODIUM PHOSPHATE 4 MG/ML IJ SOLN
INTRAMUSCULAR | Status: DC | PRN
  Administered 2012-04-15: 10 mg via INTRAVENOUS

## 2012-04-15 MED ORDER — FENTANYL CITRATE 0.05 MG/ML IJ SOLN
INTRAMUSCULAR | Status: DC | PRN
  Administered 2012-04-15: 100 ug via INTRAVENOUS

## 2012-04-15 MED ORDER — FENTANYL CITRATE 0.05 MG/ML IJ SOLN
INTRAMUSCULAR | Status: AC
  Filled 2012-04-15: qty 2

## 2012-04-15 MED ORDER — KETOROLAC TROMETHAMINE 30 MG/ML IJ SOLN: 15.0000 mg | Freq: Once | INTRAMUSCULAR | Status: DC | PRN

## 2012-04-15 MED ORDER — PROPOFOL 10 MG/ML IV EMUL
INTRAVENOUS | Status: DC | PRN
  Administered 2012-04-15: 200 mg via INTRAVENOUS

## 2012-04-15 MED ORDER — KETOROLAC TROMETHAMINE 60 MG/2ML IM SOLN
INTRAMUSCULAR | Status: DC | PRN
  Administered 2012-04-15: 30 mg via INTRAMUSCULAR

## 2012-04-15 MED ORDER — LIDOCAINE-EPINEPHRINE 1 %-1:100000 IJ SOLN
INTRAMUSCULAR | Status: DC | PRN
  Administered 2012-04-15: 20 mL

## 2012-04-15 MED ORDER — KETOROLAC TROMETHAMINE 30 MG/ML IJ SOLN
INTRAMUSCULAR | Status: DC | PRN
  Administered 2012-04-15: 30 mg via INTRAVENOUS

## 2012-04-15 MED ORDER — MIDAZOLAM HCL 2 MG/2ML IJ SOLN
INTRAMUSCULAR | Status: AC
  Filled 2012-04-15: qty 2

## 2012-04-15 MED ORDER — LIDOCAINE HCL (CARDIAC) 20 MG/ML IV SOLN
INTRAVENOUS | Status: DC | PRN
  Administered 2012-04-15: 60 mg via INTRAVENOUS

## 2012-04-15 MED ORDER — ONDANSETRON HCL 4 MG/2ML IJ SOLN
INTRAMUSCULAR | Status: AC
  Filled 2012-04-15: qty 2

## 2012-04-15 MED ORDER — LACTATED RINGERS IV SOLN
INTRAVENOUS | Status: DC
  Administered 2012-04-15: 08:00:00 via INTRAVENOUS
  Administered 2012-04-15: 50 mL/h via INTRAVENOUS
  Administered 2012-04-15: 125 mL/h via INTRAVENOUS

## 2012-04-15 MED ORDER — MIDAZOLAM HCL 5 MG/5ML IJ SOLN
INTRAMUSCULAR | Status: DC | PRN
  Administered 2012-04-15: 2 mg via INTRAVENOUS

## 2012-04-15 MED ORDER — EPHEDRINE 5 MG/ML INJ
INTRAVENOUS | Status: AC
  Filled 2012-04-15: qty 10

## 2012-04-15 MED ORDER — KETOROLAC TROMETHAMINE 60 MG/2ML IM SOLN
INTRAMUSCULAR | Status: AC
  Filled 2012-04-15: qty 2

## 2012-04-15 MED ORDER — FENTANYL CITRATE 0.05 MG/ML IJ SOLN: 25.0000 ug | INTRAMUSCULAR | Status: DC | PRN

## 2012-04-15 MED ORDER — DEXAMETHASONE SODIUM PHOSPHATE 10 MG/ML IJ SOLN
INTRAMUSCULAR | Status: AC
  Filled 2012-04-15: qty 1

## 2012-04-15 MED ORDER — ONDANSETRON HCL 4 MG/2ML IJ SOLN
INTRAMUSCULAR | Status: DC | PRN
  Administered 2012-04-15: 4 mg via INTRAVENOUS

## 2012-04-15 MED ORDER — NORETHIN ACE-ETH ESTRAD-FE 1-20 MG-MCG PO TABS: 1.0000 | ORAL_TABLET | Freq: Every day | ORAL | Status: DC

## 2012-04-15 MED ORDER — GLYCOPYRROLATE 0.2 MG/ML IJ SOLN
INTRAMUSCULAR | Status: AC
  Filled 2012-04-15: qty 1

## 2012-04-15 MED ORDER — PROPOFOL 10 MG/ML IV EMUL
INTRAVENOUS | Status: AC
  Filled 2012-04-15: qty 20

## 2012-04-15 MED ORDER — LIDOCAINE HCL (CARDIAC) 20 MG/ML IV SOLN
INTRAVENOUS | Status: AC
  Filled 2012-04-15: qty 5

## 2012-04-15 MED ORDER — EPHEDRINE SULFATE 50 MG/ML IJ SOLN
INTRAMUSCULAR | Status: DC | PRN
  Administered 2012-04-15: 10 mg via INTRAVENOUS

## 2012-04-15 SURGICAL SUPPLY — 15 items
CANISTER SUCTION 2500CC (MISCELLANEOUS) ×2 IMPLANT
CATH ROBINSON RED A/P 16FR (CATHETERS) ×2 IMPLANT
CLOTH BEACON ORANGE TIMEOUT ST (SAFETY) ×2 IMPLANT
CONTAINER PREFILL 10% NBF 60ML (FORM) ×4 IMPLANT
ELECT REM PT RETURN 9FT ADLT (ELECTROSURGICAL) ×2
ELECTRODE REM PT RTRN 9FT ADLT (ELECTROSURGICAL) ×1 IMPLANT
GLOVE BIO SURGEON STRL SZ8 (GLOVE) ×2 IMPLANT
GLOVE SURG ORTHO 8.0 STRL STRW (GLOVE) ×2 IMPLANT
GOWN PREVENTION PLUS LG XLONG (DISPOSABLE) ×2 IMPLANT
GOWN STRL REIN XL XLG (GOWN DISPOSABLE) ×2 IMPLANT
LOOP ANGLED CUTTING 22FR (CUTTING LOOP) IMPLANT
PACK HYSTEROSCOPY LF (CUSTOM PROCEDURE TRAY) ×2 IMPLANT
TOWEL OR 17X24 6PK STRL BLUE (TOWEL DISPOSABLE) ×4 IMPLANT
WATER STERILE IRR 1000ML POUR (IV SOLUTION) ×2 IMPLANT
mirena ×2 IMPLANT

## 2012-04-15 NOTE — Brief Op Note (Signed)
04/15/2012  7:55 AM  PATIENT:  Daisy Reed  31 y.o. female  PRE-OPERATIVE DIAGNOSIS:  stenotic cervix, retained IUD, desires new IUD  POST-OPERATIVE DIAGNOSIS:  stenotic cervix, retained IUD, desires new IUD  PROCEDURE:  Procedure(s) (LRB): DILATATION AND CURETTAGE /HYSTEROSCOPY (N/A) INTRAUTERINE DEVICE (IUD) REMOVAL (N/A) INTRAUTERINE DEVICE (IUD) INSERTION (N/A)  SURGEON:  Surgeon(s) and Role:    * Turner Daniels, MD - Primary  PHYSICIAN ASSISTANT:   ASSISTANTS: none   ANESTHESIA:   general  EBL:  Total I/O In: 1000 [I.V.:1000] Out: -   BLOOD ADMINISTERED:none  DRAINS: none   LOCAL MEDICATIONS USED:  MARCAINE     SPECIMEN:  No Specimen  DISPOSITION OF SPECIMEN:  N/A  COUNTS:  YES  TOURNIQUET:  * No tourniquets in log *  DICTATION: .Other Dictation: Dictation Number [id number] PLAN OF CARE: Discharge to home after PACU  PATIENT DISPOSITION:  PACU - hemodynamically stable.   Delay start of Pharmacological VTE agent (>24hrs) due to surgical blood loss or risk of bleeding: not applicable

## 2012-04-15 NOTE — Anesthesia Procedure Notes (Signed)
Procedure Name: LMA Insertion Date/Time: 04/15/2012 7:38 AM Performed by: Graciela Husbands Pre-anesthesia Checklist: Emergency Drugs available, Timeout performed, Suction available, Patient identified and Patient being monitored Patient Re-evaluated:Patient Re-evaluated prior to inductionOxygen Delivery Method: Circle system utilized Preoxygenation: Pre-oxygenation with 100% oxygen Intubation Type: IV induction Ventilation: Mask ventilation without difficulty LMA: LMA inserted LMA Size: 4.0 Number of attempts: 1 Placement Confirmation: positive ETCO2 and breath sounds checked- equal and bilateral Tube secured with: Tape Dental Injury: Teeth and Oropharynx as per pre-operative assessment

## 2012-04-15 NOTE — Transfer of Care (Signed)
Immediate Anesthesia Transfer of Care Note  Patient: Daisy Reed  Procedure(s) Performed: Procedure(s) (LRB): DILATATION AND CURETTAGE /HYSTEROSCOPY (N/A) INTRAUTERINE DEVICE (IUD) REMOVAL (N/A) INTRAUTERINE DEVICE (IUD) INSERTION (N/A)  Patient Location: PACU  Anesthesia Type: General  Level of Consciousness: awake, alert  and oriented  Airway & Oxygen Therapy: Patient Spontanous Breathing and Patient connected to nasal cannula oxygen  Post-op Assessment: Report given to PACU RN and Post -op Vital signs reviewed and stable  Post vital signs: Reviewed and stable  Complications: No apparent anesthesia complications 

## 2012-04-15 NOTE — Anesthesia Preprocedure Evaluation (Signed)
Anesthesia Evaluation  Patient identified by MRN, date of birth, ID band Patient awake    Reviewed: Allergy & Precautions, H&P , NPO status , Patient's Chart, lab work & pertinent test results, reviewed documented beta blocker date and time   History of Anesthesia Complications Negative for: history of anesthetic complications  Airway Mallampati: III TM Distance: >3 FB Neck ROM: full    Dental  (+) Teeth Intact   Pulmonary neg pulmonary ROS,  breath sounds clear to auscultation  Pulmonary exam normal       Cardiovascular Exercise Tolerance: Good negative cardio ROS  Rhythm:regular Rate:Normal     Neuro/Psych negative neurological ROS  negative psych ROS   GI/Hepatic negative GI ROS, Neg liver ROS,   Endo/Other  negative endocrine ROS  Renal/GU   Female GU complaint (retained IUD with cervical stenosis)     Musculoskeletal   Abdominal   Peds  Hematology negative hematology ROS (+)   Anesthesia Other Findings   Reproductive/Obstetrics negative OB ROS                           Anesthesia Physical Anesthesia Plan  ASA: I  Anesthesia Plan: General LMA   Post-op Pain Management:    Induction:   Airway Management Planned:   Additional Equipment:   Intra-op Plan:   Post-operative Plan:   Informed Consent: I have reviewed the patients History and Physical, chart, labs and discussed the procedure including the risks, benefits and alternatives for the proposed anesthesia with the patient or authorized representative who has indicated his/her understanding and acceptance.   Dental Advisory Given  Plan Discussed with: CRNA and Surgeon  Anesthesia Plan Comments:         Anesthesia Quick Evaluation

## 2012-04-15 NOTE — Anesthesia Postprocedure Evaluation (Signed)
Anesthesia Post Note  Patient: Daisy Reed  Procedure(s) Performed: Procedure(s) (LRB): DILATATION AND CURETTAGE /HYSTEROSCOPY (N/A) INTRAUTERINE DEVICE (IUD) REMOVAL (N/A) INTRAUTERINE DEVICE (IUD) INSERTION (N/A)  Anesthesia type: General  Patient location: PACU  Post pain: Pain level controlled  Post assessment: Post-op Vital signs reviewed  Last Vitals:  Filed Vitals:   04/15/12 0627  BP: 105/62  Pulse: 78  Temp: 37.1 C  Resp: 18    Post vital signs: Reviewed  Level of consciousness: sedated  Complications: No apparent anesthesia complicationsfj

## 2012-04-15 NOTE — Transfer of Care (Signed)
Immediate Anesthesia Transfer of Care Note  Patient: Daisy Reed  Procedure(s) Performed: Procedure(s) (LRB): DILATATION AND CURETTAGE /HYSTEROSCOPY (N/A) INTRAUTERINE DEVICE (IUD) REMOVAL (N/A) INTRAUTERINE DEVICE (IUD) INSERTION (N/A)  Patient Location: PACU  Anesthesia Type: General  Level of Consciousness: awake, alert  and oriented  Airway & Oxygen Therapy: Patient Spontanous Breathing and Patient connected to nasal cannula oxygen  Post-op Assessment: Report given to PACU RN and Post -op Vital signs reviewed and stable  Post vital signs: Reviewed and stable  Complications: No apparent anesthesia complications

## 2012-04-15 NOTE — Op Note (Signed)
NAMENUPUR, HOHMAN NO.:  [id number]  MEDICAL RECORD NO.:  [id number]  LOCATION:  [facility]                          FACILITY:  [facility]  PHYSICIAN:  Dineen Kid. Rana Snare, M.D.    DATE OF BIRTH:  10/29/1980  DATE OF PROCEDURE:  04/15/2012 DATE OF DISCHARGE:                              OPERATIVE REPORT   PREOPERATIVE DIAGNOSIS:  Retained intrauterine device, stenotic cervix, and desires new intrauterine device.  POSTOPERATIVE DIAGNOSIS:  Retained intrauterine device, stenotic cervix, and desires new intrauterine device.  PROCEDURE:  Cervical dilation, removal of IUD, and insertion of new Mirena IUD.  SURGEON:  Dineen Kid. Rana Snare, MD  ANESTHESIA:  General and local by paracervical block.  INDICATIONS:  Ms. Nobel is a 31 year old.  She has had her Mirena IUD previously for 5 years, had lost string to the cervix years ago when she presented to me.  Ultrasound showed proper placement of IUD.  She is now a 5-year window where she needs it replaced.  Cervix is stenotic and closed and unable to be dilated in the office due to the patient's discomfort.  The patient desires operative dilation of the cervix, removal of the IUD and replace the new Mirena IUD.  The risks and benefits were discussed.  Informed consent was obtained.  PROCEDURE:  After adequate analgesia, the patient placed in dorsal lithotomy position.  She was sterilely prepped and draped. Bladder is sterilely drained.  Graves speculum was placed.  Paracervical block was placed with 1% Xylocaine with 1:100,00 epinephrine, total 20 mL used. The cervix was dilated using a #21 Pratt dilator.  Polyp forceps were used to grasp and remove the Mirena IUD.  IUD appeared to be normal in shape, however, the string had apparently bent and instead of coming out through the base, it had curled up along the long arm of the IUD.  After the IUD had been removed and inspected, the uterus sounded to 8 cm.  New Mirena IUD was inserted  according management recommendations.  String cut to 1 inch in length.  Tenaculum removed.  Hemostasis was achieved with direct pressure and also with Monsel solution.  Speculum was removed.  The patient was transferred to recovery room in stable condition.  Sponge and instrument counts was normal x3.  ESTIMATED BLOOD LOSS:  Minimal.  The patient received 1 g of cefotetan preoperatively, 30 mg Toradol postoperatively.  DISPOSITION:  The patient will be discharged home and follow up in the office in 6 weeks for postop check.  Told to return for increased pain, fever, or bleeding.     Dineen Kid Rana Snare, M.D.     DCL/MEDQ  D:  04/15/2012  T:  04/15/2012  Job:  [id number]

## 2012-06-16 ENCOUNTER — Emergency Department (INDEPENDENT_AMBULATORY_CARE_PROVIDER_SITE_OTHER)
Admission: EM | Admit: 2012-06-16 | Discharge: 2012-06-16 | Disposition: A | Discharge status: Home / Self Care | Payer: BC Managed Care – PPO | Source: Home / Self Care

## 2012-06-16 ENCOUNTER — Encounter (HOSPITAL_COMMUNITY): Payer: Self-pay

## 2012-06-16 DIAGNOSIS — B349 Viral infection, unspecified: Secondary | ICD-10-CM

## 2012-06-16 DIAGNOSIS — J040 Acute laryngitis: Secondary | ICD-10-CM

## 2012-06-16 DIAGNOSIS — J329 Chronic sinusitis, unspecified: Secondary | ICD-10-CM

## 2012-06-16 DIAGNOSIS — B9789 Other viral agents as the cause of diseases classified elsewhere: Secondary | ICD-10-CM

## 2012-06-16 DIAGNOSIS — R0982 Postnasal drip: Secondary | ICD-10-CM

## 2012-06-16 DIAGNOSIS — J029 Acute pharyngitis, unspecified: Secondary | ICD-10-CM

## 2012-06-16 LAB — POCT INFECTIOUS MONO SCREEN: Mono Screen: NEGATIVE

## 2012-06-16 LAB — POCT RAPID STREP A: Streptococcus, Group A Screen (Direct): NEGATIVE

## 2012-06-16 MED ORDER — AMOXICILLIN 500 MG PO CAPS: 500.0000 mg | ORAL_CAPSULE | Freq: Three times a day (TID) | ORAL | Status: DC

## 2012-06-16 MED ORDER — HYDROCOD POLST-CHLORPHEN POLST 10-8 MG/5ML PO LQCR: 5.0000 mL | Freq: Two times a day (BID) | ORAL | Status: DC | PRN

## 2012-06-16 NOTE — ED Provider Notes (Signed)
History     CSN: [id number]  Arrival date & time 06/16/12  1923   None     Chief Complaint  Patient presents with  . Sore Throat    (Consider location/radiation/quality/duration/timing/severity/associated sxs/prior treatment) HPI Comments: 31 year old female presents with complaints of sore throat for 2 days. This is associated with runny nose, PND, cough, no fever. She is complaining of shaking chills and bodyaches. She also has laryngitis with difficulty making sounds for speech.   Past Medical History  Diagnosis Date  . Anemia     history - no meds - diet controlled    Past Surgical History  Procedure Date  . Cesarean section     x 3  . Removal of ovary     unsure of what side - done with c/s per pt  . Hysteroscopy w/d&c 04/15/2012    Procedure: DILATATION AND CURETTAGE /HYSTEROSCOPY;  Surgeon: Turner Daniels, MD;  Location: [facility];  Service: Gynecology;  Laterality: N/A;  with dilation of cervix  . Iud removal 04/15/2012    Procedure: INTRAUTERINE DEVICE (IUD) REMOVAL;  Surgeon: Turner Daniels, MD;  Location: [facility];  Service: Gynecology;  Laterality: N/A;    No family history on file.  History  Substance Use Topics  . Smoking status: Never Smoker   . Smokeless tobacco: Never Used  . Alcohol Use: Yes     socially    OB History    Grav Para Term Preterm Abortions TAB SAB Ect Mult Living                  Review of Systems  Constitutional: Positive for chills and activity change. Negative for fever, appetite change and fatigue.  HENT: Positive for congestion, rhinorrhea, neck pain and postnasal drip. Negative for facial swelling, trouble swallowing, neck stiffness and sinus pressure.   Eyes: Negative.   Respiratory: Negative.   Cardiovascular: Negative.   Gastrointestinal: Negative.   Skin: Negative for pallor and rash.  Neurological: Negative.     Allergies  Review of patient's allergies indicates no known allergies.  Home Medications   Current Outpatient  Rx  Name Route Sig Dispense Refill  . AMOXICILLIN 500 MG PO CAPS Oral Take 1 capsule (500 mg total) by mouth 3 (three) times daily. 21 capsule 0  . HYDROCOD POLST-CPM POLST ER 10-8 MG/5ML PO LQCR Oral Take 5 mLs by mouth every 12 (twelve) hours as needed. 90 mL 0  . ADULT MULTIVITAMIN W/MINERALS CH Oral Take 1 tablet by mouth daily.    Azzie Roup ACE-ETH ESTRAD-FE 1-20 MG-MCG PO TABS Oral Take 1 tablet by mouth daily. 1 Package 0    Complete this pack and then dont start new pack    BP 131/98  Pulse 103  Temp 99.2 F (37.3 C) (Oral)  Resp 18  SpO2 98%  LMP 06/05/2012  Physical Exam  Constitutional: She is oriented to person, place, and time. She appears well-developed and well-nourished. No distress.  HENT:  Mouth/Throat: No oropharyngeal exudate.       Bilateral EACs and a portion of the TMs are erythematous. The appearance is that of an abrasion and she admits to clean her ears with a Q-tip recently. OP with minor erythema and evidence of clear PND.  Eyes: Conjunctivae normal and EOM are normal. Right eye exhibits no discharge.  Neck: Normal range of motion. Neck supple.       Tenderness in the paracervical musculature to include the trapezius bilaterally. There is no posterior  or anterior cervical adenopathy.  Cardiovascular: Normal rate and normal heart sounds.        Borderline tachycardia  Pulmonary/Chest: Effort normal and breath sounds normal. No respiratory distress.  Abdominal: Soft. She exhibits no distension and no mass. There is no tenderness. There is no rebound and no guarding.       No splenomegaly or hepatomegaly  Musculoskeletal: Normal range of motion. She exhibits no edema.  Lymphadenopathy:    She has no cervical adenopathy.  Neurological: She is alert and oriented to person, place, and time. No cranial nerve deficit.  Skin: Skin is warm and dry. No rash noted.  Psychiatric: She has a normal mood and affect.    ED Course  Procedures (including critical  care time)   Labs Reviewed  POCT RAPID STREP A ([facility] ONLY)  MONONUCLEOSIS SCREEN   No results found.   1. Pharyngitis   2. Laryngitis   3. Post-nasal discharge   4. Viral syndrome       MDM  Tussionex 1 teaspoon every 12 hours when necessary Clinically, her chills and degree of apparent illness give her the benefit of the doubt of potentially having bacterial pharyngitis. And treat her with amoxicillin. Cepacol lozenges, plenty of fluids and stay well hydrated. Out of work for the next 2 days. Tylenol every 4 hours when necessary or Motrin every 6 hours when necessary  Results for orders placed during the hospital encounter of 06/16/12  POCT RAPID STREP A ([facility] ONLY)      Component Value Range   Streptococcus, Group A Screen (Direct) NEGATIVE  NEGATIVE        Hayden Rasmussen, NP 06/16/12 2128

## 2012-06-16 NOTE — ED Provider Notes (Signed)
Medical screening examination/treatment/procedure(s) were performed by non-physician practitioner and as supervising physician I was immediately available for consultation/collaboration.  [name], M.D.   [name], MD 06/16/12 2247 

## 2012-06-16 NOTE — ED Notes (Signed)
C/o sorethroat and fever since Monday.

## 2013-08-28 IMAGING — CR DG CHEST 2V
2 series · 2 of 2 positions shown · non-contrast
Comparison: 08/21/2004

CLINICAL DATA: Cough and congestion for 3 days.

CHEST - 2 VIEW

[view not recorded (1 of 2)]
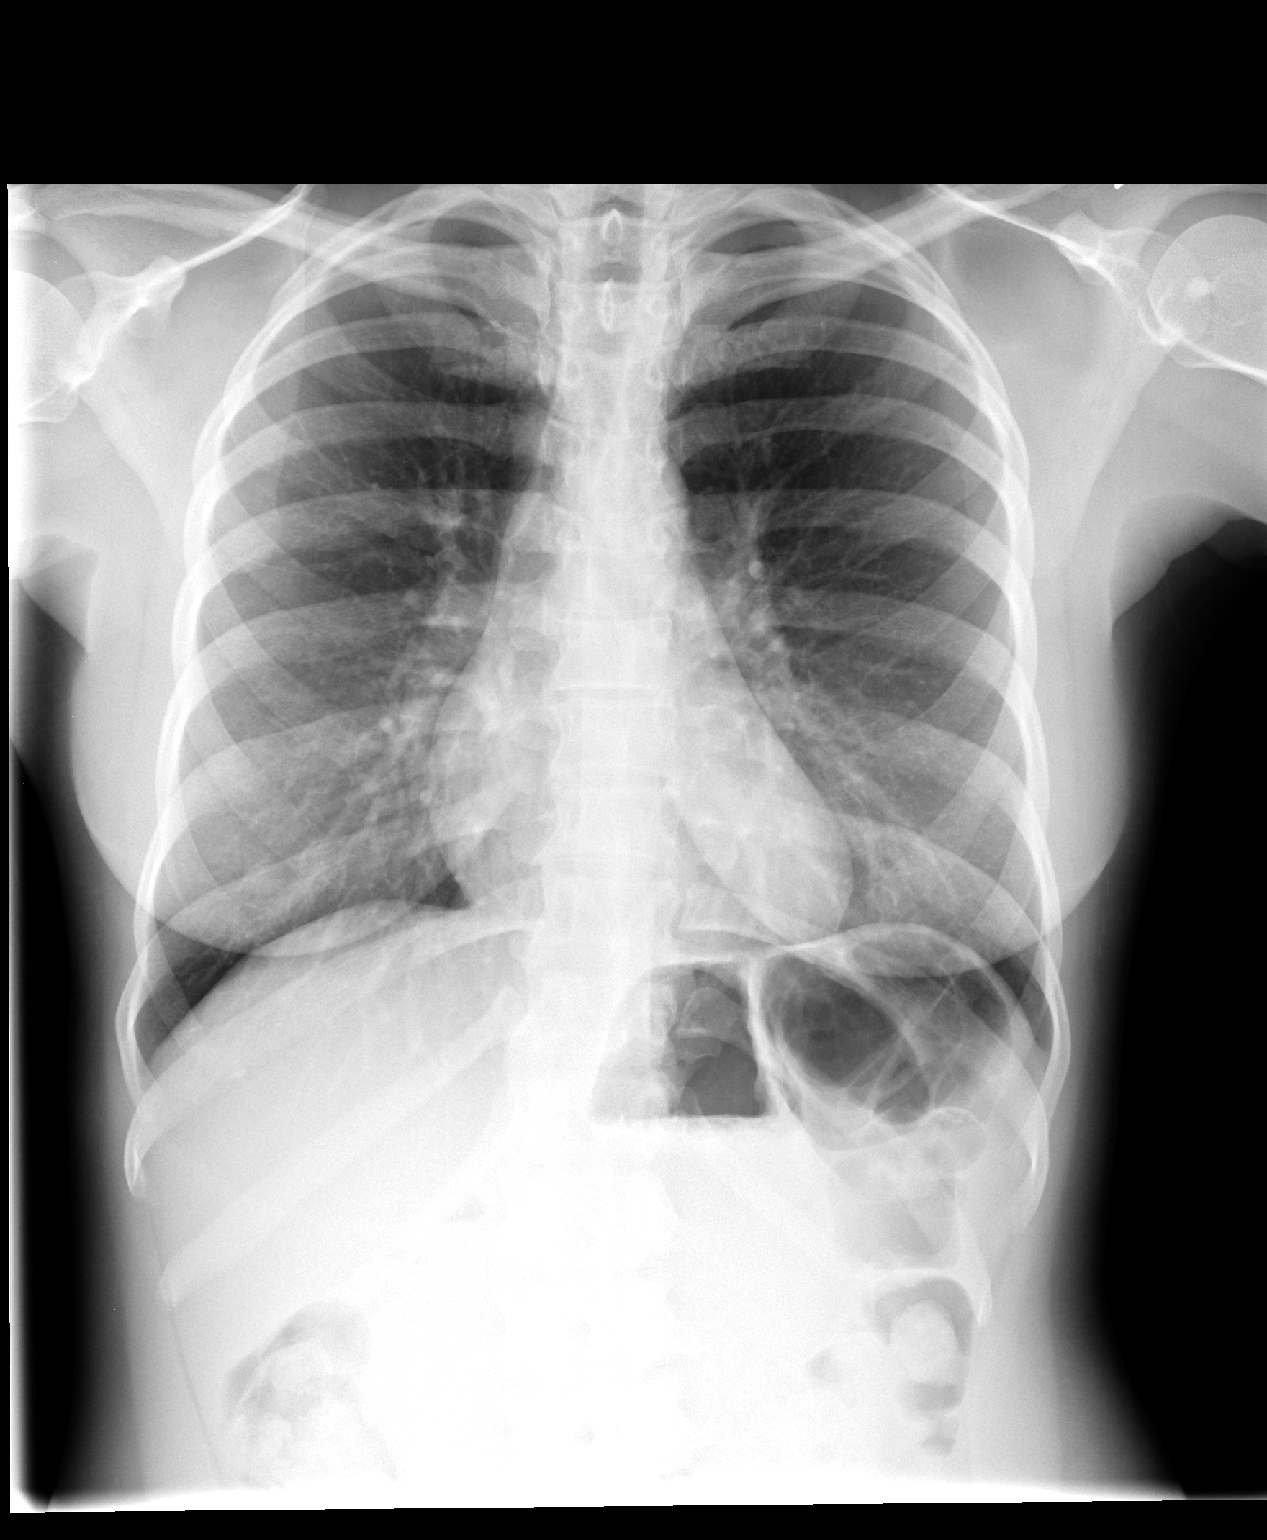

[view not recorded (2 of 2)]
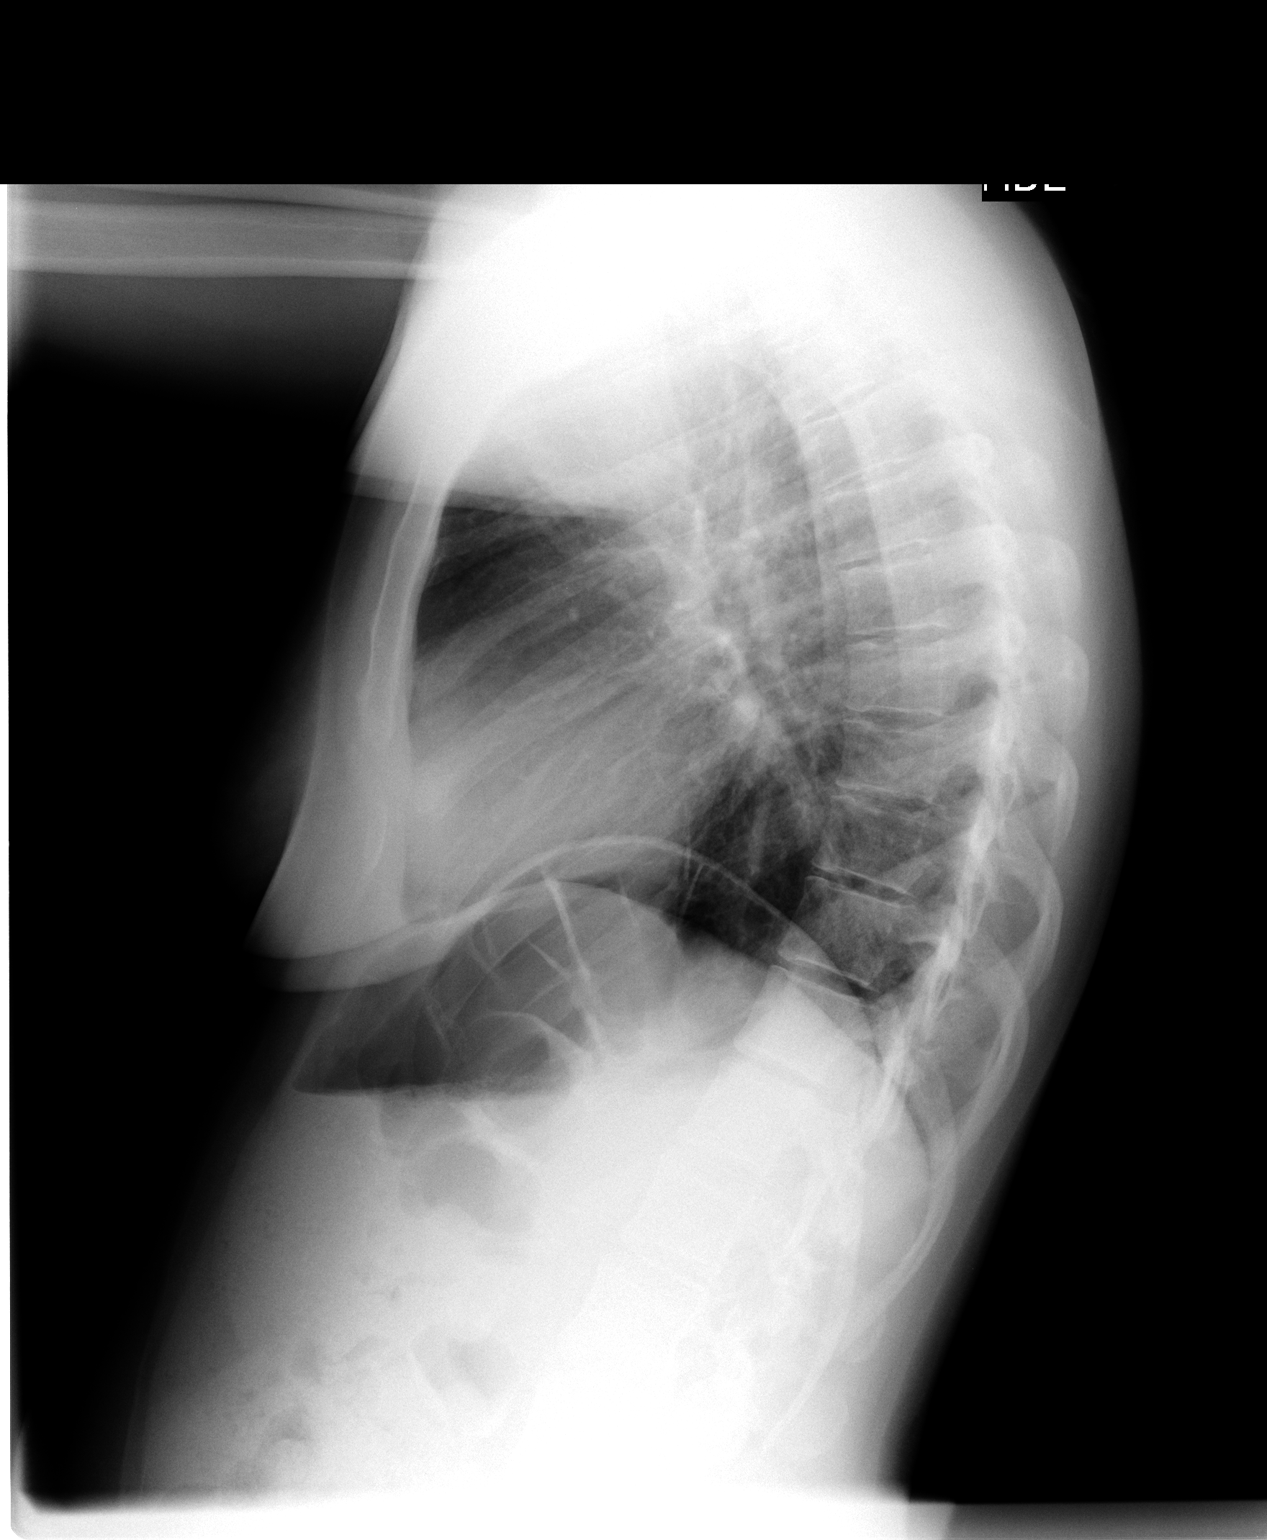

[2 of 2 positions shown; findings below may reference images not displayed]

FINDINGS: Symmetric round densities projecting over the lung bases
probably represent nipple shadows.

The lungs appear clear. Cardiac and mediastinal contours appear
unremarkable.

No pleural effusion is observed.
IMPRESSION: 1.  No significant abnormality identified.

## 2013-12-13 ENCOUNTER — Emergency Department
Admission: EM | Admit: 2013-12-13 | Discharge: 2013-12-13 | Disposition: A | Discharge status: Home / Self Care | Payer: Charity | Attending: Emergency Medicine | Admitting: Emergency Medicine

## 2013-12-13 ENCOUNTER — Emergency Department: Payer: Charity

## 2013-12-13 ENCOUNTER — Other Ambulatory Visit: Payer: Self-pay

## 2013-12-13 DIAGNOSIS — J020 Streptococcal pharyngitis: Secondary | ICD-10-CM | POA: Insufficient documentation

## 2013-12-13 LAB — GROUP A STREP, RAPID ANTIGEN: Group A Strep, Rapid Antigen: POSITIVE — AB

## 2013-12-13 MED ORDER — HYDROCODONE-ACETAMINOPHEN 5-325 MG PO TABS
1.0000 | ORAL_TABLET | Freq: Four times a day (QID) | ORAL | 0 refills | Status: AC | PRN
Start: 2013-12-13 — End: 2013-12-23
  Filled 2013-12-13: qty 15, 4d supply, fill #0

## 2013-12-13 MED ORDER — MORPHINE SULFATE 4 MG/ML IJ/IV SOLN (WRAP)
4.0000 mg | Freq: Once | Status: AC
Start: 2013-12-13 — End: 2013-12-13
  Administered 2013-12-13: 4 mg via INTRAVENOUS
  Filled 2013-12-13: qty 1

## 2013-12-13 MED ORDER — PENICILLIN V POTASSIUM 500 MG PO TABS
500.0000 mg | ORAL_TABLET | Freq: Three times a day (TID) | ORAL | 0 refills | Status: AC
Start: 2013-12-13 — End: 2013-12-23
  Filled 2013-12-13: qty 30, 10d supply, fill #0

## 2013-12-13 MED ORDER — ONDANSETRON HCL 4 MG/2ML IJ SOLN
4.0000 mg | Freq: Once | INTRAMUSCULAR | Status: AC
Start: 2013-12-13 — End: 2013-12-13
  Administered 2013-12-13: 4 mg via INTRAVENOUS
  Filled 2013-12-13: qty 2

## 2013-12-13 MED ORDER — SODIUM CHLORIDE 0.9 % IV BOLUS
1000.0000 mL | Freq: Once | INTRAVENOUS | Status: AC
Start: 2013-12-13 — End: 2013-12-13
  Administered 2013-12-13: 1000 mL via INTRAVENOUS

## 2013-12-13 MED ORDER — KETOROLAC TROMETHAMINE 30 MG/ML IJ SOLN
30.0000 mg | Freq: Once | INTRAMUSCULAR | Status: AC
Start: 2013-12-13 — End: 2013-12-13
  Administered 2013-12-13: 30 mg via INTRAVENOUS
  Filled 2013-12-13: qty 1

## 2013-12-13 NOTE — ED Provider Notes (Signed)
Physician/Midlevel provider first contact with patient: 12/13/13 1236             EMERGENCY DEPARTMENT HISTORY AND PHYSICAL EXAM    Date: 12/13/2013  Patient Name: Linda Ramsey,Linda Ramsey      Disposition and Treatment Plan    Clinical Impression:   1. Strep pharyngitis      Disposition: Discharge to home       History of Presenting Illness     Chief Complaint   Patient presents with   . Sore Throat     Context: home  Location: throat  Duration: 3 days  Quality: sore  Timing: persistent  Maximum Severity: severe  Modifying Factors: worse with swallowing  History Obtained From: patient  Additional History: Linda Ramsey is a 33 y.o. female who has no pertinent PMH; presents with sore throat for 3 days with associated hoarsness.  Describes pain as 10/10.  She has had decreased po intake since onset due to pain with swallowing, rhinorrhea, congestion, L otalgia, and fever (TMax 100.7F).  She has been taking Mucinex without relief of sxs.  Patient denies SOB, dyspnea, difficulty swallowing, nausea, vomiting, abdominal pain, neck pain, back pain, or any other concerns at this time.    PCP: No primary provider on file.    Current facility-administered medications:[COMPLETED] ketorolac (TORADOL) injection 30 mg, 30 mg, Intravenous, Once, Giani Winther, PA, 30 mg at 12/13/13 1305;  [COMPLETED] morphine injection 4 mg, 4 mg, Intravenous, Once, Shandi Godfrey, PA, 4 mg at 12/13/13 1305;  [COMPLETED] ondansetron (ZOFRAN) injection 4 mg, 4 mg, Intravenous, Once, Jaedon Siler, PA, 4 mg at 12/13/13 1305  [COMPLETED] sodium chloride 0.9 % bolus 1,000 mL, 1,000 mL, Intravenous, Once, Hortensia Duffin, PA, 1,000 mL at 12/13/13 1250  Current outpatient prescriptions:HYDROcodone-acetaminophen (NORCO) 5-325 MG per tablet, Take 1 tablet by mouth every 6 (six) hours as needed for Pain., Disp: 15 tablet, Rfl: 0;  penicillin v potassium (VEETID) 500 MG tablet, Take 1 tablet (500 mg total) by mouth 3 (three) times daily., Disp: 30 tablet, Rfl:  0    Past Medical History     History reviewed. No pertinent past medical history.  Past Surgical History   Procedure Date   . Cesarean section        Family History     History reviewed. No pertinent family history.    Social History     History     Social History   . Marital Status: N/A     Spouse Name: N/A     Number of Children: N/A   . Years of Education: N/A     Social History Main Topics   . Smoking status: Never Smoker    . Smokeless tobacco: Not on file   . Alcohol Use: No   . Drug Use: No   . Sexually Active: Not on file     Other Topics Concern   . Not on file     Social History Narrative   . No narrative on file       Allergies     No Known Allergies    Review of Systems     Positive and negative ROS elements as per HPI.  All Other Systems Reviewed and Negative: Yes    Physical Exam   BP 111/71  Pulse 75  Temp 100.4 F (38 C)  Resp 16  Ht 1.575 m  Wt 58.968 kg  BMI 23.77 kg/m2  SpO2 99%  Constitutional: Vital signs reviewed.   Head: Normocephalic, atraumatic  Eyes: No conjunctival injection. No discharge. EOM are normal.   ENT: Mucous membranes moist b/l pharnygeal erythema, no exudate, no pta, midline uvula, no trismus, no drooling, no stridor, no change in voice  Neck: Normal range of motion. Trachea midline. Neck supple.   Respiratory/Chest: BS bl, CTA, no wheezing  Cardiovascular:RRR, no murmur  Abdomen: soft nontender  Upper Extremities:No edema or cyanosis  Lower Extremities:No edema or cyanosis  Musculoskeletal: Normal range of motion.    Neurological: Alert Normal and symmetric motor tone by observation. Speech normal. Memory Normal. No facial assymetry.  Skin:  Warm and dry. No rashes noted.   Psychiatric: Normal affect and normal concentration for age    Diagnostic Study Results   Labs -     Results     Procedure Component Value Units Date/Time    LAB-Rapid Strep [[id number]]  (Abnormal) Collected:12/13/13 1253    Specimen Information:Throat Updated:12/13/13 1326     Group A Strep, Rapid  Antigen Positive (A)         Radiologic Studies -   Radiology Results (24 Hour)     ** No Results found for the last 24 hours. **      .    EKG Interpretation: NA    Clinical Course in the Emergency Department     Patient discharged by me at 2:05 PM  Departure Condition: Good, stable  Mobility at Discharge: Ambulatory  Patient Teaching: Discharge instructions reviewed and patient verbalizes understanding  Departure Mode: By self With friend        Medical Decision Making     I am the first provider for this patient.  I reviewed the vital signs, nursing notes, past medical history, past surgical history, family history and social history.      Vital Signs -   Patient Vitals for the past 12 hrs:   BP Temp Pulse Resp   12/13/13 1322 111/71 mmHg - 75  -   12/13/13 1159 129/73 mmHg 100.4 F (38 C) 94  16        Pulse Oximetry Analysis - Normal    Differential Diagnosis (not completely inclusive): PTA, pharyngitis, URI    Laboratory results reviewed by EDP: yes  Radiologic study results reviewed by EDP: N\A  Radiologic Studies Interpreted (viewed) by EDP: N\A        _______________________________    I am the midlevel provider for this patient and I personally performed the services documented. Jenell Milliner is scribing for me on Bier,Naijah. This note accurately reflects  work and decisions made by me.    I am scribing for Edmonia Caprio PA on Bryan Lemma (ED Scribe)  12:25 PM  12/13/2013    Vernell Leep PA, personally performed the services documented by Jenell Milliner who is scribing for me    ______________________________            Edmonia Caprio, PA  12/13/13 [phone]

## 2013-12-13 NOTE — ED Provider Notes (Signed)
Attending Note    Seen with PA - positive strep.    My exam - alert, uncomfortable.  Pharynx - eyrthema with minimal exudate - no trismus, sialorrhea, stridor.    Plan - pain meds, fluids, antibiotics.    I personally discussed plan of care with patient.    Maurine Minister, MD  12/13/13 1408

## 2013-12-13 NOTE — Discharge Instructions (Signed)
It was a pleasure seeing you in the Emergency Department today!  .    You have been diagnosed with strep pharyngitis. Please take the antibiotics as prescribed.  Follow up with your primary care physician within 1-3 day(s).  If you do not have a primary care physician, you may call the [facility] Referral Line listed below for a referral.  Return to the ED for any of the reasons listed below, for any worsening symptoms, or for any new concerns.    Thank you for visiting [place] in the Emergency Department!        [facility] Referral Line    You have been referred to a primary care doctor or a specialist for follow-up care. Please call the [facility] referral line and they will be able to help you find a physician in your area that you can follow up with.    Phone: [phone] or [phone]    Pharyngitis, Strep    You have been diagnosed with strep pharyngitis.    Strep pharyngitis (strep throat) is an infection in the back of the throat and tonsils. It is caused by bacteria called Streptococcus pyogenes. The word "pharyngitis" means "sore throat." The doctor might use a test to find out whether your sore throat was caused by strep bacteria or a virus. If a virus caused the sore throat, antibiotics will not help. Taking antibiotics when they are not needed is dangerous because it leads to resistance. This means the drugs will not work as well when they are needed the next time.    Symptoms of strep pharyngitis include fever, sore throat, painful swallowing, headache, abdominal pain and vomiting. You might see a rash that feels like sandpaper. You might have swollen tender neck glands. There are usually white spots on the tonsils.    Most people with cold symptoms (runny or stuffy nose or cough) do NOT have strep throat, even if their throats are sore. The doctor might not test you for strep throat. Some people have strep bacteria in the throat all the time but don't get sick. We call these people "carriers." A carrier will  always test positive for strep, even though the strep didn't cause the sore throat. If all 4 of the following are true, it is likely your sore throat was caused by strep bacteria:   You have a fever, either here or at home.   There are white spots in the back of your throat.   Your lymph nodes (glands) are swollen.   You have NO other cold symptoms, like a stuffy nose, runny nose, or cough.    Strep pharyngitis is treated with antibiotics, medicine for pain and fever, and fluids. It is VERY IMPORTANT that you take all of the antibiotics as directed, even if you are feeling better before the antibiotics are finished.    YOU SHOULD SEEK MEDICAL ATTENTION IMMEDIATELY, EITHER HERE OR AT THE NEAREST EMERGENCY DEPARTMENT, IF ANY OF THE FOLLOWING OCCURS:   You have trouble breathing.   You are hoarse or your voice changes.   You drool or have difficulty swallowing.   Your sore throat gets worse or you start to have neck pain.   You are unable to take liquids or medicine.   You get worse at any time or do not get better in 2 to 3 days.

## 2013-12-13 NOTE — ED Notes (Signed)
HIV Rapid Test 12/13/2013: HIV Negative

## 2017-03-16 ENCOUNTER — Ambulatory Visit (HOSPITAL_COMMUNITY)
Admission: EM | Admit: 2017-03-16 | Discharge: 2017-03-16 | Disposition: A | Discharge status: Home / Self Care | Payer: PRIVATE HEALTH INSURANCE | Attending: Internal Medicine | Admitting: Internal Medicine

## 2017-03-16 ENCOUNTER — Encounter (HOSPITAL_COMMUNITY): Payer: Self-pay | Admitting: Emergency Medicine

## 2017-03-16 DIAGNOSIS — H1033 Unspecified acute conjunctivitis, bilateral: Secondary | ICD-10-CM

## 2017-03-16 MED ORDER — TOBRAMYCIN 0.3 % OP SOLN: 1.0000 [drp] | OPHTHALMIC | 0 refills | Status: AC

## 2017-03-16 NOTE — ED Provider Notes (Signed)
CSN: [id number]     Arrival date & time 03/16/17  1836 History   First MD Initiated Contact with Patient 03/16/17 1904     Chief Complaint  Patient presents with  . Eye Problem   (Consider location/radiation/quality/duration/timing/severity/associated sxs/prior Treatment) The history is provided by the patient.  Eye Problem  Location:  Both eyes Quality:  Aching Severity:  Moderate Onset quality:  Gradual Duration:  1 day Timing:  Constant Progression:  Worsening Chronicity:  New Context comment:  Exposure to "pink eye" Relieved by:  Nothing Worsened by:  Nothing Ineffective treatments:  None tried Associated symptoms: crusting, discharge, itching, redness and tearing   Associated symptoms: no decreased vision, no double vision, no inflammation and no photophobia   Risk factors: exposure to pinkeye     Past Medical History:  Diagnosis Date  . Anemia    history - no meds - diet controlled   Past Surgical History:  Procedure Laterality Date  . CESAREAN SECTION     x 3  . HYSTEROSCOPY W/D&C  04/15/2012   Procedure: DILATATION AND CURETTAGE /HYSTEROSCOPY;  Surgeon: Turner Daniels, MD;  Location: [facility];  Service: Gynecology;  Laterality: N/A;  with dilation of cervix  . IUD REMOVAL  04/15/2012   Procedure: INTRAUTERINE DEVICE (IUD) REMOVAL;  Surgeon: Turner Daniels, MD;  Location: [facility];  Service: Gynecology;  Laterality: N/A;  . removal of ovary     unsure of what side - done with c/s per pt   History reviewed. No pertinent family history. Social History  Substance Use Topics  . Smoking status: Never Smoker  . Smokeless tobacco: Never Used  . Alcohol use Yes     Comment: socially   OB History    No data available     Review of Systems  Constitutional: Negative.   HENT: Negative.   Eyes: Positive for discharge, redness and itching. Negative for double vision and photophobia.  Respiratory: Negative.   Cardiovascular: Negative.   Gastrointestinal: Negative.    Musculoskeletal: Negative.   Neurological: Negative.     Allergies  Patient has no known allergies.  Home Medications   Prior to Admission medications   Medication Sig Start Date End Date Taking? Authorizing Provider  tobramycin (TOBREX) 0.3 % ophthalmic solution Place 1 drop into both eyes every 4 (four) hours. 03/16/17   Dorena Bodo, NP   Meds Ordered and Administered this Visit  Medications - No data to display  BP (!) 117/56 (BP Location: Right Arm)   Pulse 72   Temp 98.3 F (36.8 C) (Oral)   Resp 16   SpO2 100%  No data found.   Physical Exam  Constitutional: She is oriented to person, place, and time. She appears well-developed and well-nourished. No distress.  HENT:  Head: Normocephalic and atraumatic.  Right Ear: External ear normal.  Left Ear: External ear normal.  Eyes: EOM are normal. Pupils are equal, round, and reactive to light. Right eye exhibits discharge. Left eye exhibits discharge.  Bilateral injected conjunctiva  Neck: Normal range of motion.  Neurological: She is alert and oriented to person, place, and time.  Skin: Skin is warm and dry. Capillary refill takes less than 2 seconds. No rash noted. She is not diaphoretic. No erythema.  Psychiatric: She has a normal mood and affect. Her behavior is normal.  Nursing note and vitals reviewed.   Urgent Care Course     Procedures (including critical care time)  Labs Review Labs Reviewed - No data  to display  Imaging Review No results found.     MDM   1. Acute bacterial conjunctivitis of both eyes    Treating for conjunctivitis, started on Tobrex, provided counseling on hand hygiene, follow-up with ophthalmology as needed if symptoms persist.   Dorena BodoKennard, Denisse Whitenack, NP 03/16/17 1936

## 2017-03-16 NOTE — Discharge Instructions (Signed)
For your conjunctivitis, I have prescribed Tobrex, place 1 drop in each eye every 4 hours while awake for the next 7 days at any time if your symptoms worsen, follow up with an ophthalmologist. You are contagious, I provided a work note. You should be able to return to work after 24 hours on antibiotics.

## 2017-03-16 NOTE — ED Triage Notes (Signed)
[facility] a [facility] eye draining, itching and matting x 2 days.

## 2017-04-20 ENCOUNTER — Ambulatory Visit (HOSPITAL_COMMUNITY)
Admission: EM | Admit: 2017-04-20 | Discharge: 2017-04-20 | Disposition: A | Discharge status: Home / Self Care | Payer: PRIVATE HEALTH INSURANCE | Attending: Family Medicine | Admitting: Family Medicine

## 2017-04-20 ENCOUNTER — Encounter (HOSPITAL_COMMUNITY): Payer: Self-pay | Admitting: *Deleted

## 2017-04-20 DIAGNOSIS — R05 Cough: Secondary | ICD-10-CM

## 2017-04-20 DIAGNOSIS — J029 Acute pharyngitis, unspecified: Secondary | ICD-10-CM

## 2017-04-20 DIAGNOSIS — R053 Chronic cough: Secondary | ICD-10-CM

## 2017-04-20 MED ORDER — AZITHROMYCIN 250 MG PO TABS: 250.0000 mg | ORAL_TABLET | Freq: Every day | ORAL | 0 refills | Status: AC

## 2017-04-20 NOTE — ED Provider Notes (Signed)
  [facility]   [id number] 04/20/17 Arrival Time: 1012  ASSESSMENT & PLAN:  1. Cough, persistent   2. Sore throat     Meds ordered this encounter  Medications  . azithromycin (ZITHROMAX) 250 MG tablet    Sig: Take 1 tablet (250 mg total) by mouth daily. Take first 2 tablets together, then 1 every day until finished.    Dispense:  6 tablet    Refill:  0   Treating based on time course of illness without improvement and to cover atypicals. Continue OTC medicines as needed. Will f/u if not showing improvement over the next few days, sooner if worsening.  Reviewed expectations re: course of current medical issues. Questions answered. Outlined signs and symptoms indicating need for more acute intervention. Patient verbalized understanding. After Visit Summary given.   SUBJECTIVE:  Daisy Reed is a 36 y.o. female who presents with complaint of cold symptoms for 1.5-2 weeks. Not improving. Persistent dry cough without wheezing or SOB. Non-smoker. No fever reported. ST but tolerating PO intake. OTC with mild but temporary help. Body aches initially that have improved. No specific aggravating or alleviating factors reported. No recent travel.  ROS: As per HPI.   OBJECTIVE:  Vitals:   04/20/17 1059  BP: 126/82  Pulse: 78  Resp: 18  Temp: 98.6 F (37 C)  TempSrc: Oral  SpO2: 100%     General appearance: alert; no distress HEENT: nasal congestion; clear runny nose; conjunctivae normal; TMs normal; throat with cobblestoning/irritation from apparent post-nasal drainage; tonsils normal Neck: supple Lungs: clear to auscultation bilaterally; dry cough Skin: warm and dry Psychological:  alert and cooperative; normal mood and affect  No Known Allergies  PMHx, SurgHx, SocialHx, Medications, and Allergies were reviewed in the Visit Navigator and updated as appropriate.       Mardella LaymanHagler, Mirakle Tomlin, MD 04/20/17 1113

## 2017-04-20 NOTE — ED Triage Notes (Signed)
Pt reports     Symptoms  Of   Cough   Congestion   Body     Aches   And   sorethroat  For  Several   Weeks        Pt  Is    Awake   And  Alert  And  Oriented  She  Is masked   And  Is  In no  Acute  Distress

## 2023-08-11 ENCOUNTER — Other Ambulatory Visit: Payer: Self-pay | Admitting: Obstetrics and Gynecology

## 2023-08-11 DIAGNOSIS — R928 Other abnormal and inconclusive findings on diagnostic imaging of breast: Secondary | ICD-10-CM

## 2023-08-21 ENCOUNTER — Ambulatory Visit
Admission: RE | Admit: 2023-08-21 | Discharge: 2023-08-21 | Disposition: A | Discharge status: Home / Self Care | Payer: PRIVATE HEALTH INSURANCE | Source: Ambulatory Visit | Attending: Obstetrics and Gynecology | Admitting: Obstetrics and Gynecology

## 2023-08-21 DIAGNOSIS — R928 Other abnormal and inconclusive findings on diagnostic imaging of breast: Secondary | ICD-10-CM

## 2023-08-24 ENCOUNTER — Other Ambulatory Visit: Payer: Self-pay | Admitting: Obstetrics and Gynecology

## 2023-08-24 DIAGNOSIS — R921 Mammographic calcification found on diagnostic imaging of breast: Secondary | ICD-10-CM

## 2024-11-14 ENCOUNTER — Ambulatory Visit (HOSPITAL_COMMUNITY): Admit: 2024-11-14 | Payer: PRIVATE HEALTH INSURANCE | Admitting: Obstetrics and Gynecology
